# Patient Record
Sex: Male | Born: 1950 | Race: White | Hispanic: No | Marital: Married | Smoking: Never smoker
Health system: Southern US, Community
[De-identification: ages and names within clinical notes are randomized; demographics above are authoritative.]

## PROBLEM LIST (undated history)

## (undated) DIAGNOSIS — N200 Calculus of kidney: Secondary | ICD-10-CM

## (undated) DIAGNOSIS — I4510 Unspecified right bundle-branch block: Secondary | ICD-10-CM

## (undated) DIAGNOSIS — M1990 Unspecified osteoarthritis, unspecified site: Secondary | ICD-10-CM

## (undated) DIAGNOSIS — K8020 Calculus of gallbladder without cholecystitis without obstruction: Secondary | ICD-10-CM

## (undated) DIAGNOSIS — K2980 Duodenitis without bleeding: Secondary | ICD-10-CM

## (undated) DIAGNOSIS — K648 Other hemorrhoids: Secondary | ICD-10-CM

## (undated) DIAGNOSIS — H269 Unspecified cataract: Secondary | ICD-10-CM

## (undated) DIAGNOSIS — K5730 Diverticulosis of large intestine without perforation or abscess without bleeding: Secondary | ICD-10-CM

## (undated) DIAGNOSIS — K219 Gastro-esophageal reflux disease without esophagitis: Secondary | ICD-10-CM

## (undated) DIAGNOSIS — U071 COVID-19: Secondary | ICD-10-CM

## (undated) DIAGNOSIS — I341 Nonrheumatic mitral (valve) prolapse: Secondary | ICD-10-CM

## (undated) DIAGNOSIS — J302 Other seasonal allergic rhinitis: Secondary | ICD-10-CM

## (undated) DIAGNOSIS — Z8719 Personal history of other diseases of the digestive system: Secondary | ICD-10-CM

## (undated) HISTORY — DX: Calculus of gallbladder without cholecystitis without obstruction: K80.20

## (undated) HISTORY — DX: Unspecified cataract: H26.9

## (undated) HISTORY — DX: Diverticulosis of large intestine without perforation or abscess without bleeding: K57.30

## (undated) HISTORY — PX: EYE SURGERY: SHX253

## (undated) HISTORY — DX: Other hemorrhoids: K64.8

## (undated) HISTORY — DX: Unspecified right bundle-branch block: I45.10

## (undated) HISTORY — DX: Gastro-esophageal reflux disease without esophagitis: K21.9

## (undated) HISTORY — DX: Calculus of kidney: N20.0

## (undated) HISTORY — DX: COVID-19: U07.1

## (undated) HISTORY — DX: Duodenitis without bleeding: K29.80

## (undated) HISTORY — PX: DENTAL SURGERY: SHX609

---

## 1976-12-13 HISTORY — PX: WISDOM TOOTH EXTRACTION: SHX21

## 2000-09-16 ENCOUNTER — Encounter (INDEPENDENT_AMBULATORY_CARE_PROVIDER_SITE_OTHER): Payer: Self-pay | Admitting: Specialist

## 2000-09-16 ENCOUNTER — Other Ambulatory Visit: Admission: RE | Admit: 2000-09-16 | Discharge: 2000-09-16 | Payer: Self-pay | Admitting: Internal Medicine

## 2003-05-21 ENCOUNTER — Encounter: Payer: Self-pay | Admitting: Internal Medicine

## 2005-10-04 ENCOUNTER — Ambulatory Visit: Payer: Self-pay | Admitting: Internal Medicine

## 2005-11-02 ENCOUNTER — Ambulatory Visit: Payer: Self-pay | Admitting: Internal Medicine

## 2008-12-02 ENCOUNTER — Telehealth: Payer: Self-pay | Admitting: Internal Medicine

## 2008-12-17 DIAGNOSIS — K219 Gastro-esophageal reflux disease without esophagitis: Secondary | ICD-10-CM

## 2008-12-17 DIAGNOSIS — K2980 Duodenitis without bleeding: Secondary | ICD-10-CM | POA: Insufficient documentation

## 2008-12-17 DIAGNOSIS — K922 Gastrointestinal hemorrhage, unspecified: Secondary | ICD-10-CM | POA: Insufficient documentation

## 2008-12-20 ENCOUNTER — Ambulatory Visit: Payer: Self-pay | Admitting: Internal Medicine

## 2008-12-20 DIAGNOSIS — K648 Other hemorrhoids: Secondary | ICD-10-CM | POA: Insufficient documentation

## 2010-10-19 ENCOUNTER — Encounter: Payer: Self-pay | Admitting: Internal Medicine

## 2010-10-20 ENCOUNTER — Telehealth: Payer: Self-pay | Admitting: Internal Medicine

## 2010-11-18 ENCOUNTER — Ambulatory Visit: Payer: Self-pay | Admitting: Internal Medicine

## 2011-01-12 NOTE — Progress Notes (Signed)
Summary: Medication  Phone Note Call from Patient Call back at Home Phone [phone]   Caller: Patient Call For: Dr. Juanda Chance Reason for Call: Talk to Nurse Summary of Call: Needs a refill on his ANUSOL.Marland Kitchensch'd appt. on 11-18-10 Initial call taken by: Karna Christmas,  October 20, 2010 10:53 AM  Follow-up for Phone Call        Patient states that he just needs refills on anusol suppositories to keep onhand for hemorrhoidal flare ups. He denies any associated abdominal pain, change in bowels ect with his rectal bleeding. I will send him a 1 month supply. Patient is scheduled for a followup on 11/18/10 (his last visit with [place] was in January 2010, almost 2 years ago) and he would like to confirm with the physician that she does need to see him for routine follow up... Follow-up by: Lamona Curl CMA Duncan Dull),  October 20, 2010 11:20 AM  Additional Follow-up for Phone Call Additional follow up Details #1::        I agree. Additional Follow-up by: Hart Carwin MD,  October 20, 2010 1:36 PM    New/Updated Medications: ANUSOL-HC 25 MG SUPP (HYDROCORTISONE ACETATE) Insert suppository into rectum once at bedtime. Prescriptions: ANUSOL-HC 25 MG SUPP (HYDROCORTISONE ACETATE) Insert suppository into rectum once at bedtime.  #12 x 0   Entered by:   Lamona Curl CMA (AAMA)   Authorized by:   Hart Carwin MD   Signed by:   Lamona Curl CMA (AAMA) on 10/20/2010   Method used:   Electronically to        [facility] (retail)       [address] [place], [place]  [address]       Ph: [phone]       Fax: [phone]   RxID:   [id number]

## 2011-01-12 NOTE — Assessment & Plan Note (Signed)
Summary: med refill--ch.   History of Present Illness Visit Type: Follow-up Visit Primary GI MD: Lina Sar MD Primary Provider: Fredia Beets, MD  Requesting Provider: na Chief Complaint: Pt needs refill on Anucort and has questions about Nexium. Pt denies any GI complaints  History of Present Illness:   This is a 60 year old white male with symptomatic internal hemorrhoids. He needs a refill on his Anusol-HC suppositories. His last appointment was in January 2010. He has used up only one prescription since his last appointment. His hemorrhoids do not bother him often but when they do the suppositories usually relieve the symptoms within 2 or 3 days. Patient's last colonoscopy in 2006 confirmed the presence of hemorrhoids. A prior colonoscopy in November 2001 showed a hyperplastic polyp. There is a family history of colon cancer in patient's mother's sister. He is treated for gastroesophageal reflux with Nexium 40 mg daily. His last upper endoscopy in 2001 showed a hiatal hernia and duodenitis. His H. pylori was negative.   GI Review of Systems      Denies abdominal pain, acid reflux, belching, bloating, chest pain, dysphagia with liquids, dysphagia with solids, heartburn, loss of appetite, nausea, vomiting, vomiting blood, weight loss, and  weight gain.      Reports hemorrhoids.     Denies anal fissure, black tarry stools, change in bowel habit, constipation, diarrhea, diverticulosis, fecal incontinence, heme positive stool, irritable bowel syndrome, jaundice, light color stool, liver problems, rectal bleeding, and  rectal pain.    Current Medications (verified): 1)  Nexium 40 Mg Cpdr (Esomeprazole Magnesium) .Marland Kitchen.. 1 By Mouth Once Daily 2)  Zantac 150 Mg Caps (Ranitidine Hcl) .Marland Kitchen.. 1 By Mouth Once Daily 3)  Hyoscyamine Sulfate 0.125 Mg Tbdp (Hyoscyamine Sulfate) .... As Needed 4)  Optivar 0.05 % Soln (Azelastine Hcl) .... 2-3 Per Day 5)  Luxiq 0.12 % Foam (Betamethasone Valerate) .... As  Needed 6)  Metrolotion 0.75 % Lotn (Metronidazole) .Marland Kitchen.. 1-2 Per Day 7)  Zolpidem Tartrate 5 Mg Tabs (Zolpidem Tartrate) .Marland Kitchen.. 1 By Mouth Once Daily 8)  Skelaxin 800 Mg Tabs (Metaxalone) .... As Needed 9)  Beano  Tabs (Alpha-D-Galactosidase) .... As Needed 10)  Anti-Gas Ultra Strength 180 Mg Caps (Simethicone) .... As Needed 11)  Anusol-Hc 25 Mg Supp (Hydrocortisone Acetate) .... Insert Suppository Into Rectum Once At Bedtime. 12)  Allegra Allergy 60 Mg Tabs (Fexofenadine Hcl) .... By Mouth Once Daily  Allergies (verified): 1)  ! Pcn 2)  ! Sulfa  Past History:  Past Medical History: HEMORRHOIDS, INTERNAL (ICD-455.0) Hx of GI BLEEDING (ICD-578.9) Family Hx of COLON CANCER (ICD-153.9) Hx of DUODENITIS (ICD-535.60) GERD (ICD-530.81)  Past Surgical History: Reviewed history from 12/17/2008 and no changes required. dental surgery  Family History: Reviewed history from 12/17/2008 and no changes required. Family History of Colon Cancer: Maternal Aunt Family History of Prostate Cancer: Paternal Uncle Family History of Heart Disease: Mother, Father  Social History: Reviewed history from 12/17/2008 and no changes required. Occupation: Education officer, environmental Alcohol Use - no Illicit Drug Use - no  Review of Systems       The patient complains of allergy/sinus.  The patient denies anemia, anxiety-new, arthritis/joint pain, back pain, blood in urine, breast changes/lumps, change in vision, confusion, cough, coughing up blood, depression-new, fainting, fatigue, fever, headaches-new, hearing problems, heart murmur, heart rhythm changes, itching, menstrual pain, muscle pains/cramps, night sweats, nosebleeds, pregnancy symptoms, shortness of breath, skin rash, sleeping problems, sore throat, swelling of feet/legs, swollen lymph glands, thirst - excessive , urination - excessive ,  urination changes/pain, urine leakage, vision changes, and voice change.         Pertinent positive and negative review of systems  were noted in the above HPI. All other ROS was otherwise negative.   Vital Signs:  Patient profile:   60 year old male Height:      70 inches Weight:      173 pounds BMI:     24.91 BSA:     1.96 Pulse rate:   80 / minute Pulse rhythm:   regular BP sitting:   128 / 64  (left arm) Cuff size:   regular  Vitals Entered By: Ok Anis CMA (November 18, 2010 8:18 AM)   Impression & Recommendations:  Problem # 1:  HEMORRHOIDS, INTERNAL (ICD-455.0) Patient has symptomatic internal hemorrhoids responsive to [facility] suppositories. Patient is satisfied with using suppositories. We will refill this. He is due for a repeat colonoscopy in November 2013.  Problem # 2:  Family Hx of COLON CANCER (ICD-153.9) There is a family history of colon cancer in a maternal aunt. A recall colonoscopy will be due in November 2013.  Problem # 3:  GERD (ICD-530.81) continue Nexium 40 mg daily  Patient Instructions: 1)  Refill Anusol-HC suppositories one q.h.s. p.r.n. 2)  High-fiber diet. 3)  Continue Nexium 40 mg daily. 4)  A recall colonoscopy will be due in November 2013. 5)  Copy sent to : Dr Kathyrn Lass 6)  The medication list was reviewed and reconciled.  All changed / newly prescribed medications were explained.  A complete medication list was provided to the patient / caregiver. Prescriptions: ANUSOL-HC 25 MG SUPP (HYDROCORTISONE ACETATE) Insert suppository into rectum once at bedtime.  #12 x 2   Entered by:   Lamona Curl CMA (AAMA)   Authorized by:   Hart Carwin MD   Signed by:   Lamona Curl CMA (AAMA) on 11/18/2010   Method used:   Electronically to        [facility] (retail)       [address] [place], [place]  [address]       Ph: [phone]       Fax: [phone]   RxID:   [id number]

## 2011-01-12 NOTE — Miscellaneous (Signed)
Summary: Anusol Suppositories  Clinical Lists Changes  Medications: Changed medication from ANUSOL-HC 25 MG SUPP (HYDROCORTISONE ACETATE) Insert suppository into rectum once at bedtime to ANUSOL-HC 25 MG SUPP (HYDROCORTISONE ACETATE) Insert suppository into rectum once at bedtime. MUST HAVE OFFICE VISITS FOR FURTHER REFILLS! - Signed Rx of ANUSOL-HC 25 MG SUPP (HYDROCORTISONE ACETATE) Insert suppository into rectum once at bedtime. MUST HAVE OFFICE VISITS FOR FURTHER REFILLS!;  #12 x 0;  Signed;  Entered by: Lamona Curl CMA (AAMA);  Authorized by: Hart Carwin MD;  Method used: Electronically to [facility] [address], [place], [place], [place]  [address], Ph: [phone], Fax: [phone]    Prescriptions: ANUSOL-HC 25 MG SUPP (HYDROCORTISONE ACETATE) Insert suppository into rectum once at bedtime. MUST HAVE OFFICE VISITS FOR FURTHER REFILLS!  #12 x 0   Entered by:   Lamona Curl CMA (AAMA)   Authorized by:   Hart Carwin MD   Signed by:   Lamona Curl CMA (AAMA) on 10/19/2010   Method used:   Electronically to        [facility] (retail)       [address] [place], [place]  [address]       Ph: [phone]       Fax: [phone]   RxID:   [id number]   Appended Document: Anusol Suppositories prescription failed to go through....patient actually needs an office visit anyways. I have called an left him a message to schedule appointment.

## 2011-12-14 HISTORY — PX: COLONOSCOPY: SHX174

## 2012-09-14 ENCOUNTER — Encounter: Payer: Self-pay | Admitting: Internal Medicine

## 2012-09-25 ENCOUNTER — Encounter: Payer: Self-pay | Admitting: Internal Medicine

## 2012-11-15 ENCOUNTER — Ambulatory Visit (AMBULATORY_SURGERY_CENTER): Payer: 59

## 2012-11-15 VITALS — Ht 71.25 in | Wt 175.0 lb

## 2012-11-15 DIAGNOSIS — Z800 Family history of malignant neoplasm of digestive organs: Secondary | ICD-10-CM

## 2012-11-15 DIAGNOSIS — Z860100 Personal history of colon polyps, unspecified: Secondary | ICD-10-CM

## 2012-11-15 DIAGNOSIS — Z1211 Encounter for screening for malignant neoplasm of colon: Secondary | ICD-10-CM

## 2012-11-15 MED ORDER — MOVIPREP 100 G PO SOLR: 1.0000 | Freq: Once | ORAL | Status: DC

## 2012-11-29 ENCOUNTER — Ambulatory Visit (AMBULATORY_SURGERY_CENTER): Payer: 59 | Admitting: Internal Medicine

## 2012-11-29 ENCOUNTER — Encounter: Payer: Self-pay | Admitting: Internal Medicine

## 2012-11-29 VITALS — BP 122/81 | HR 70 | Temp 97.5°F | Resp 17 | Ht 71.25 in | Wt 175.0 lb

## 2012-11-29 DIAGNOSIS — K5730 Diverticulosis of large intestine without perforation or abscess without bleeding: Secondary | ICD-10-CM

## 2012-11-29 DIAGNOSIS — Z800 Family history of malignant neoplasm of digestive organs: Secondary | ICD-10-CM

## 2012-11-29 DIAGNOSIS — Z860100 Personal history of colon polyps, unspecified: Secondary | ICD-10-CM

## 2012-11-29 DIAGNOSIS — Z1211 Encounter for screening for malignant neoplasm of colon: Secondary | ICD-10-CM

## 2012-11-29 HISTORY — DX: Diverticulosis of large intestine without perforation or abscess without bleeding: K57.30

## 2012-11-29 MED ORDER — SODIUM CHLORIDE 0.9 % IV SOLN: 500.0000 mL | INTRAVENOUS | Status: DC

## 2012-11-29 NOTE — Patient Instructions (Addendum)
Handouts were given to your care partner on diverticulosis and high fiber diet.  You may resume your current medications today.  Pleae call if any questions or concerns.    YOU HAD AN ENDOSCOPIC PROCEDURE TODAY AT THE [facility]: Refer to the procedure report that was given to you for any specific questions about what was found during the examination.  If the procedure report does not answer your questions, please call your gastroenterologist to clarify.  If you requested that your care partner not be given the details of your procedure findings, then the procedure report has been included in a sealed envelope for you to review at your convenience later.  YOU SHOULD EXPECT: Some feelings of bloating in the abdomen. Passage of more gas than usual.  Walking can help get rid of the air that was put into your GI tract during the procedure and reduce the bloating. If you had a lower endoscopy (such as a colonoscopy or flexible sigmoidoscopy) you may notice spotting of blood in your stool or on the toilet paper. If you underwent a bowel prep for your procedure, then you may not have a normal bowel movement for a few days.  DIET: Your first meal following the procedure should be a light meal and then it is ok to progress to your normal diet.  A half-sandwich or bowl of soup is an example of a good first meal.  Heavy or fried foods are harder to digest and may make you feel nauseous or bloated.  Likewise meals heavy in dairy and vegetables can cause extra gas to form and this can also increase the bloating.  Drink plenty of fluids but you should avoid alcoholic beverages for 24 hours.  ACTIVITY: Your care partner should take you home directly after the procedure.  You should plan to take it easy, moving slowly for the rest of the day.  You can resume normal activity the day after the procedure however you should NOT DRIVE or use heavy machinery for 24 hours (because of the sedation medicines used  during the test).    SYMPTOMS TO REPORT IMMEDIATELY: A gastroenterologist can be reached at any hour.  During normal business hours, 8:30 AM to 5:00 PM Monday through Friday, call [phone].  After hours and on weekends, please call the GI answering service at [phone] who will take a message and have the physician on call contact you.   Following lower endoscopy (colonoscopy or flexible sigmoidoscopy):  Excessive amounts of blood in the stool  Significant tenderness or worsening of abdominal pains  Swelling of the abdomen that is new, acute  Fever of 100F or higher   FOLLOW UP: If any biopsies were taken you will be contacted by phone or by letter within the next 1-3 weeks.  Call your gastroenterologist if you have not heard about the biopsies in 3 weeks.  Our staff will call the home number listed on your records the next business day following your procedure to check on you and address any questions or concerns that you may have at that time regarding the information given to you following your procedure. This is a courtesy call and so if there is no answer at the home number and we have not heard from you through the emergency physician on call, we will assume that you have returned to your regular daily activities without incident.  SIGNATURES/CONFIDENTIALITY: You and/or your care partner have signed paperwork which will be entered into your electronic  medical record.  These signatures attest to the fact that that the information above on your After Visit Summary has been reviewed and is understood.  Full responsibility of the confidentiality of this discharge information lies with you and/or your care-partner.  

## 2012-11-29 NOTE — Progress Notes (Signed)
No complaints noted in the recovery room. Maw  Patient did not experience any of the following events: a burn prior to discharge; a fall within the facility; wrong site/side/patient/procedure/implant event; or a hospital transfer or hospital admission upon discharge from the facility. (G8907) Patient did not have preoperative order for IV antibiotic SSI prophylaxis. (G8918)  

## 2012-11-29 NOTE — Op Note (Signed)
[facility] [address] [place], [address]   COLONOSCOPY PROCEDURE REPORT  PATIENT: Raymond, Wood  MR#: [id number] BIRTHDATE: 1951/11/17 , 61  yrs. old GENDER: Male ENDOSCOPIST: Hart Carwin, MD REFERRED BY:  Lindaann Slough, M.D. PROCEDURE DATE:  11/29/2012 PROCEDURE:   Colonoscopy, surveillance ASA CLASS:   Class II INDICATIONS:Patient's personal history of colon polyps and maternal aunt with colon cancer, hyperplastic polyp in 2001, last colon 2006. MEDICATIONS: MAC sedation, administered by CRNA and propofol (Diprivan) 300mg  IV  DESCRIPTION OF PROCEDURE:   After the risks and benefits and of the procedure were explained, informed consent was obtained.  A digital rectal exam revealed no abnormalities of the rectum.    The LB PCF-H180AL [id number] and LB PCF-H180AL [id number]  endoscope was introduced through the anus and advanced to the cecum, which was identified by both the appendix and ileocecal valve .  The quality of the prep was good, using MoviPrep .  The instrument was then slowly withdrawn as the colon was fully examined.     COLON FINDINGS: Mild diverticulosis was noted in the sigmoid colon. Retroflexed views revealed no abnormalities.     The scope was then withdrawn from the patient and the procedure completed.  COMPLICATIONS: There were no complications. ENDOSCOPIC IMPRESSION: Mild diverticulosis was noted in the sigmoid colon  RECOMMENDATIONS: High fiber diet   REPEAT EXAM: for Colonoscopy. 10 years  cc:  _______________________________ eSignedHart Carwin, MD 11/29/2012 9:49 AM

## 2012-11-30 ENCOUNTER — Telehealth: Payer: Self-pay | Admitting: *Deleted

## 2012-11-30 NOTE — Telephone Encounter (Signed)
  Follow up Call-  Call back number 11/29/2012  Post procedure Call Back phone  # [phone]  Permission to leave phone message Yes     Patient questions:  Do you have a fever, pain , or abdominal swelling? no Pain Score  0 *  Have you tolerated food without any problems? yes  Have you been able to return to your normal activities? yes  Do you have any questions about your discharge instructions: Diet   no Medications  no Follow up visit  no  Do you have questions or concerns about your Care? no  Actions: * If pain score is 4 or above:0 No action needed, pain <4.

## 2013-02-26 ENCOUNTER — Telehealth: Payer: Self-pay | Admitting: Internal Medicine

## 2013-02-26 MED ORDER — HYDROCORTISONE ACETATE 25 MG RE SUPP: 25.0000 mg | RECTAL | Status: DC | PRN

## 2013-02-26 NOTE — Telephone Encounter (Signed)
Limited supply of Anusol HC sent to patient's pharmacy.

## 2013-02-27 ENCOUNTER — Telehealth: Payer: Self-pay | Admitting: Internal Medicine

## 2013-02-27 ENCOUNTER — Ambulatory Visit (INDEPENDENT_AMBULATORY_CARE_PROVIDER_SITE_OTHER): Payer: Managed Care, Other (non HMO) | Admitting: Physician Assistant

## 2013-02-27 ENCOUNTER — Encounter: Payer: Self-pay | Admitting: *Deleted

## 2013-02-27 VITALS — BP 112/78 | HR 73 | Ht 71.0 in | Wt 174.0 lb

## 2013-02-27 DIAGNOSIS — K5730 Diverticulosis of large intestine without perforation or abscess without bleeding: Secondary | ICD-10-CM | POA: Insufficient documentation

## 2013-02-27 DIAGNOSIS — K589 Irritable bowel syndrome without diarrhea: Secondary | ICD-10-CM

## 2013-02-27 DIAGNOSIS — R110 Nausea: Secondary | ICD-10-CM

## 2013-02-27 NOTE — Progress Notes (Signed)
Subjective:    Patient ID: Raymond Wood, male    DOB: 07-06-1951, 62 y.o.   MRN: [id number]  HPI Raymond Wood is a pleasant 62 year old white male known to Dr. Vincent Gros who has history of IBS, GERD, colon polyps and hemorrhoids. He  does have positive family history of colon cancer in a maternal aunt. He just had repeat colonoscopy in December of 2013 it did not have any recurrent polyps, he does have mild diverticular disease. He states that he had an episode of prostatitis about a month ago and initially was started on a course of Cipro but after a few doses had some mild nausea and was switched to doxycycline which he took for 3 weeks. He says generally his bowel habits are very regular but since being on antibiotics and after finishing antibiotics he is still having 2 bowel movements daily though no diarrhea. He is also developed some nausea which is most notable early in the mornings over the past few days. He has not had any vomiting. Also had an increase in gas. His wife encouraged him to come in because of the nausea. Again has been eating pretty well, he has taken some Levsin which seems to help the nausea. He has been on Nexium 40 mg by mouth daily for a long time and takes Zantac at bedtime intermittently. He denies any heartburn indigestion dysphagia or odynophagia. He does mention that he always gets GI upset whenever  he takes antibiotics.    Review of Systems  Constitutional: Negative.   Eyes: Negative.   Respiratory: Negative.   Cardiovascular: Negative.   Gastrointestinal: Positive for nausea.  Endocrine: Negative.   Genitourinary: Negative.   Allergic/Immunologic: Negative.   Neurological: Negative.   Psychiatric/Behavioral: Negative.    Outpatient Prescriptions Prior to Visit  Medication Sig Dispense Refill  . Azelaic Acid (FINACEA) 15 % cream Apply topically daily. After skin is thoroughly washed and patted dry, gently but thoroughly massage a thin film of azelaic acid cream  into the affected area twice daily, in the morning and evening.      Marland Kitchen azelastine (OPTIVAR) 0.05 % ophthalmic solution Place 1 drop into both eyes 2 (two) times daily.      . Carboxymethylcellul-Glycerin 0.5-0.9 % SOLN Apply to eye daily.      Marland Kitchen esomeprazole (NEXIUM) 40 MG capsule Take 40 mg by mouth daily before breakfast.      . fexofenadine (ALLEGRA) 180 MG tablet Take 180 mg by mouth daily.      . hydrocortisone (ANUSOL-HC) 25 MG suppository Place 1 suppository (25 mg total) rectally as needed.  12 suppository  0  . hyoscyamine (ANASPAZ) 0.125 MG TBDP Place 0.125 mg under the tongue.      . Magnesium 250 MG TABS Take by mouth as needed.      . metaxalone (SKELAXIN) 800 MG tablet Take 800 mg by mouth daily.      . ranitidine (ZANTAC) 150 MG tablet Take 150 mg by mouth daily.      . Simethicone (PHAZYME) 180 MG CAPS Take by mouth as needed.      . psyllium (REGULOID) 0.52 G capsule Take 0.52 g by mouth daily.      Marland Kitchen zolpidem (AMBIEN) 5 MG tablet Take 2.5 mg by mouth at bedtime as needed.       No facility-administered medications prior to visit.   Allergies  Allergen Reactions  . Penicillins   . Rocephin (Ceftriaxone Sodium In Dextrose)     Blood  in stool  . Sulfonamide Derivatives    Patient Active Problem List  Diagnosis  . HEMORRHOIDS, INTERNAL  . GERD  . DUODENITIS  . GI BLEEDING  . Irritable bowel syndrome (IBS)  . Diverticulosis of colon without hemorrhage   History  Substance Use Topics  . Smoking status: Never Smoker   . Smokeless tobacco: Never Used  . Alcohol Use: Not on file   family history includes Colon cancer in his maternal aunt; Heart disease in his father and mother; and Prostate cancer in his paternal aunt.     Objective:   Physical Exam  white male in no acute distress, quite pleasant blood pressure 112/78 pulse 73 height 5 foot 11 weight 174. HEENT; nontraumatic normocephalic EOMI PERRLA sclera anicteric,Neck; Supple no JVD, Cardiovascular; regular  rate and rhythm with S1-S2 no murmur or gallop, capillary clear bilaterally, Abdomen; soft nontender nondistended bowel sounds are active no palpable mass or hepatosplenomegaly, Rectal ;exam not done, Extremities; no clubbing cyanosis or edema skin warm and dry, Psych; mood and affect normal and appropriate        Assessment & Plan:  #42 62 year old white male with history of IBS now presenting with alteration of bowel habits gas and vague early morning nausea since completing a long course of doxycycline I suspect his symptoms are secondary to alteration of his gut flora by antibiotics, he also may have an antibiotic-induced gastropathy. #2 history of colon polyps-oh recurrent polyps on colonoscopy December 2013 #3 family history of colon cancer-up-to-date with colon  Screening as above  Plan; we'll increase Nexium to 40 mg by mouth twice daily over the next 2 weeks if improved back to once daily Start a probiotic once daily over the next month, he may try Training and development officer. Patient is asked to call back in 2 weeks with progress report if he has not had significant improvement he will need further diagnostic evaluation

## 2013-02-27 NOTE — Progress Notes (Signed)
Reviewed, antibiotic?? Induced gastropathy, if no improvement with current regimen, consider abd .sono, etc

## 2013-02-27 NOTE — Telephone Encounter (Signed)
Patient states he finished antibiotics from PCP about 10 days ago for prostatitis. For the last 3-4 days, he is having abdominal pain, gas and nausea. Scheduled with [name], PA today at 2:00 PM.

## 2013-02-27 NOTE — Patient Instructions (Addendum)
Take Nexium 40 mg twice daily for 14 days. We have given you samples . Then go to once daily. We have given you coupons for Align, a probiotil. Take one capsule daily.  You can get this at your pharmacy or [employer], [employer], [employer].    You may also take Levsin or Hyoscyamine for nausea and spasms. Call us back in 2 weeks if you are not significantly better.

## 2013-12-13 HISTORY — PX: BLEPHAROPLASTY: SUR158

## 2014-12-20 ENCOUNTER — Other Ambulatory Visit (INDEPENDENT_AMBULATORY_CARE_PROVIDER_SITE_OTHER): Payer: Managed Care, Other (non HMO)

## 2014-12-20 ENCOUNTER — Ambulatory Visit (INDEPENDENT_AMBULATORY_CARE_PROVIDER_SITE_OTHER): Payer: Managed Care, Other (non HMO) | Admitting: Physician Assistant

## 2014-12-20 ENCOUNTER — Encounter: Payer: Self-pay | Admitting: Physician Assistant

## 2014-12-20 ENCOUNTER — Telehealth: Payer: Self-pay | Admitting: Internal Medicine

## 2014-12-20 VITALS — BP 122/80 | HR 76 | Ht 72.0 in | Wt 176.0 lb

## 2014-12-20 DIAGNOSIS — K5730 Diverticulosis of large intestine without perforation or abscess without bleeding: Secondary | ICD-10-CM

## 2014-12-20 DIAGNOSIS — K589 Irritable bowel syndrome without diarrhea: Secondary | ICD-10-CM

## 2014-12-20 LAB — CBC WITH DIFFERENTIAL/PLATELET
BASOS ABS: 0 10*3/uL (ref 0.0–0.1)
Basophils Relative: 0.5 % (ref 0.0–3.0)
Eosinophils Absolute: 0 10*3/uL (ref 0.0–0.7)
Eosinophils Relative: 0.9 % (ref 0.0–5.0)
HEMATOCRIT: 42 % (ref 39.0–52.0)
Hemoglobin: 14.1 g/dL (ref 13.0–17.0)
Lymphocytes Relative: 24.3 % (ref 12.0–46.0)
Lymphs Abs: 1.2 10*3/uL (ref 0.7–4.0)
MCHC: 33.5 g/dL (ref 30.0–36.0)
MCV: 97.6 fl (ref 78.0–100.0)
Monocytes Absolute: 0.4 10*3/uL (ref 0.1–1.0)
Monocytes Relative: 8.7 % (ref 3.0–12.0)
NEUTROS ABS: 3.2 10*3/uL (ref 1.4–7.7)
Neutrophils Relative %: 65.6 % (ref 43.0–77.0)
Platelets: 171 10*3/uL (ref 150.0–400.0)
RBC: 4.3 Mil/uL (ref 4.22–5.81)
RDW: 13.1 % (ref 11.5–15.5)
WBC: 4.8 10*3/uL (ref 4.0–10.5)

## 2014-12-20 MED ORDER — HYOSCYAMINE SULFATE 0.125 MG SL SUBL: SUBLINGUAL_TABLET | SUBLINGUAL | Status: DC

## 2014-12-20 NOTE — Patient Instructions (Signed)
We have given you  Literature on a Low fat diet and High Fiber Diet. We [facility] [address].   Take Benefiber, 1 heaping tablespoon in a glass of water or juice daily.  Call us in 1 week and let us know how you are feeling.  You may ask for the nurse, Rene Kocheregina.

## 2014-12-20 NOTE — Telephone Encounter (Signed)
Spoke with patient and he reports bloating, gas and abdominal discomfort for several weeks. States the symptoms are increasing. Denies constipation or diarrhea. Scheduled with Lawson FiscalLori Hvozdovic, PA-C today at 2:15 PM.

## 2014-12-20 NOTE — Progress Notes (Signed)
Patient ID: Raymond Wood, male   DOB: 05/13/1951, 64 y.o.   MRN: [id number]     History of Present Illness:   Raymond Wood is a 64 year old male who presents today for evaluation of lower abdominal discomfort of one month's duration.  The patient is known to Dr. Juanda ChanceBrodie with a history of GERD, IBS, diverticulosis, colon polyps, and hemorrhoids. He last had a colonoscopy in December 2013 with no polyps but he was noted to have mild diverticular disease. He states that 5 years ago he had an episode of diverticulitis and was treated with a course of Cipro and Flagyl. Since that time he states once or twice a year, he will developed lower abdominal crampy pain with gas and bloating and erratic bowel movements. Each time he contacts a friend who is a physician and has been treated with Cipro alone because he states the metronidazole was difficult for him to tolerate. In the first week of December 2015 he started with mild symptoms of diverticulitis which he describes has mild left lower quadrant and suprapubic discomfort, with gas, bloating, gurgling. He does not have any associated fever, chills, night sweats, nausea or vomiting. He spoke to his friend and was prescribed 10 days of Cipro. While on the Cipro he had total resolution of his symptoms but soon thereafter they reoccurred and he feels he never quite cleared up. He describes mild left lower quadrant and suprapubic discomfort but not pain. His discomfort is more pronounced postprandially and is described has some cramping relieved with passage of flatulence or defecation. He has been avoiding fiber in his stools have been soft formed. He has had no bright red blood per rectum or melena.   Past Medical History  Diagnosis Date  . Right bundle branch block   . Diverticulosis of sigmoid colon 11/29/2012  . Internal hemorrhoids without mention of complication   . Duodenitis without mention of hemorrhage   . GERD (gastroesophageal reflux disease)     Past  Surgical History  Procedure Laterality Date  . No surgeries other than dental     Family History  Problem Relation Age of Onset  . Colon cancer Maternal Aunt   . Prostate cancer Paternal Aunt   . Heart disease Mother   . Heart disease Father    History  Substance Use Topics  . Smoking status: Never Smoker   . Smokeless tobacco: Never Used  . Alcohol Use: Not on file   Current Outpatient Prescriptions  Medication Sig Dispense Refill  . Azelaic Acid (FINACEA) 15 % cream Apply topically daily. After skin is thoroughly washed and patted dry, gently but thoroughly massage a thin film of azelaic acid cream into the affected area twice daily, in the morning and evening.    Marland Kitchen. azelastine (OPTIVAR) 0.05 % ophthalmic solution Place 1 drop into both eyes 2 (two) times daily.    . cetirizine (ZYRTEC) 10 MG tablet Take 10 mg by mouth daily.    Marland Kitchen. esomeprazole (NEXIUM) 40 MG capsule Take 40 mg by mouth daily before breakfast.    . hydrocortisone (ANUSOL-HC) 25 MG suppository Place 1 suppository (25 mg total) rectally as needed. 12 suppository 0  . hyoscyamine (ANASPAZ) 0.125 MG TBDP Place 0.125 mg under the tongue.    . Magnesium 250 MG TABS Take by mouth as needed.    . metaxalone (SKELAXIN) 800 MG tablet Take 800 mg by mouth daily.    . Probiotic Product (ALIGN) 4 MG CAPS Take by mouth.    .Marland Kitchen  ranitidine (ZANTAC) 150 MG tablet Take 150 mg by mouth daily.    . Simethicone (PHAZYME) 180 MG CAPS Take by mouth as needed.    . tazarotene (TAZORAC) 0.05 % cream Apply 0.05 % topically at bedtime.    . hyoscyamine (LEVSIN/SL) 0.125 MG SL tablet Place 1 tablet under the tongue 3 times a day as needed. 90 tablet 1   No current facility-administered medications for this visit.   Allergies  Allergen Reactions  . Penicillins   . Rocephin [Ceftriaxone Sodium In Dextrose]     Blood in stool  . Sulfonamide Derivatives       Review of Systems: Gen: Denies any fever, chills, sweats, anorexia, fatigue,  weakness, malaise, weight loss, and sleep disorder CV: Denies chest pain, angina, palpitations, syncope, orthopnea, PND, peripheral edema, and claudication. Resp: Denies dyspnea at rest, dyspnea with exercise, cough, sputum, wheezing, coughing up blood, and pleurisy. GI: Denies vomiting blood, jaundice, and fecal incontinence.   Denies dysphagia or odynophagia. GU : Denies urinary burning, blood in urine, urinary frequency, urinary hesitancy, nocturnal urination, and urinary incontinence. MS: Denies joint pain, limitation of movement, and swelling, stiffness, low back pain, extremity pain. Denies muscle weakness, cramps, atrophy.  Derm: Denies rash, itching, dry skin, hives, moles, warts, or unhealing ulcers.  Psych: Denies depression, anxiety, memory loss, suicidal ideation, hallucinations, paranoia, and confusion. Heme: Denies bruising, bleeding, and enlarged lymph nodes. Neuro:  Denies any headaches, dizziness, paresthesia Endo:  Denies any problems with DM, thyroid, adrenal  LAB RESULTS:  Recent Labs  12/20/14 1502  WBC 4.8  HGB 14.1  HCT 42.0  PLT 171.0     Physical Exam: General: Pleasant, well developed male in no acute distress Head: Normocephalic and atraumatic Eyes:  sclerae anicteric, conjunctiva pink  Ears: Normal auditory acuity Lungs: Clear throughout to auscultation Heart: Regular rate and rhythm Abdomen: Soft, non distended, non-tender. No masses, no hepatomegaly. Normal bowel sounds. No CVAT. Musculoskeletal: Symmetrical with no gross deformities  Extremities: No edema  Neurological: Alert oriented x 4, grossly nonfocal Psychological:  Alert and cooperative. Normal mood and affect  Assessment and Recommendations: Study 48-year-old male with a history of IBS and diverticular disease presenting now with a several week history of vague, mild, crampy lower abdominal discomfort associated with gas and bloating. He readily admits to multiple dietary indiscretions since  Thanksgiving with a diet of high fat-containing foods and minimal fiber. His symptoms are likely due to IBS or a segment of spastic diverticular disease. He's been instructed to reintroduce fiber to his diet and decrease fat content in his diet. A CBC will be obtained today, and if his white blood cell count is elevated and/or if his pain becomes worse, a CT of the abdomen and pelvis will be obtained however as of today's visit he is nontender with no rebound or guarding. He will use Benefiber heaping tablespoon daily. He will be given a trial of Levsin 0.125 mg 1 by mouth 3 times a day when necessary. He's been instructed to call us in a week and let us know how he is feeling, sooner if needed.   Aundrey Elahi, Moise Boring 12/20/2014,

## 2014-12-21 NOTE — Progress Notes (Signed)
Reviewed and agree with LevsinSL tid, dietary modifications. If no improvement in a certain timde ( few weeks)  Will obtain imaging of the abdomen.

## 2017-08-05 ENCOUNTER — Ambulatory Visit (INDEPENDENT_AMBULATORY_CARE_PROVIDER_SITE_OTHER): Payer: Medicare Other | Admitting: Physician Assistant

## 2017-08-05 ENCOUNTER — Encounter: Payer: Self-pay | Admitting: Physician Assistant

## 2017-08-05 ENCOUNTER — Other Ambulatory Visit (INDEPENDENT_AMBULATORY_CARE_PROVIDER_SITE_OTHER): Payer: Medicare Other

## 2017-08-05 VITALS — BP 100/70 | HR 80 | Ht 71.25 in | Wt 166.2 lb

## 2017-08-05 DIAGNOSIS — K8020 Calculus of gallbladder without cholecystitis without obstruction: Secondary | ICD-10-CM | POA: Diagnosis not present

## 2017-08-05 DIAGNOSIS — R7989 Other specified abnormal findings of blood chemistry: Secondary | ICD-10-CM

## 2017-08-05 DIAGNOSIS — K8050 Calculus of bile duct without cholangitis or cholecystitis without obstruction: Secondary | ICD-10-CM | POA: Diagnosis not present

## 2017-08-05 DIAGNOSIS — R945 Abnormal results of liver function studies: Secondary | ICD-10-CM

## 2017-08-05 LAB — HEPATIC FUNCTION PANEL
ALT: 19 U/L (ref 0–53)
AST: 20 U/L (ref 0–37)
Albumin: 4.2 g/dL (ref 3.5–5.2)
Alkaline Phosphatase: 85 U/L (ref 39–117)
BILIRUBIN TOTAL: 0.9 mg/dL (ref 0.2–1.2)
Bilirubin, Direct: 0.2 mg/dL (ref 0.0–0.3)
Total Protein: 7.1 g/dL (ref 6.0–8.3)

## 2017-08-05 NOTE — Patient Instructions (Addendum)
Your physician has requested that you go to the basement for the following lab work before leaving today: Hepatic function panel  Please follow a low fat diet and eat small meals.  We will send our note from today to Dr Magnus Ivan with [facility].  You will be established with Dr Marina Goodell.   Go to the emergency room in [place] if you have any recurrent severe attacks.  If you are age 66 or older, your body mass index should be between 23-30. Your Body mass index is 23.02 kg/m. If this is out of the aforementioned range listed, please consider follow up with your Primary Care Provider.  If you are age 48 or younger, your body mass index should be between 19-25. Your Body mass index is 23.02 kg/m. If this is out of the aformentioned range listed, please consider follow up with your Primary Care Provider.

## 2017-08-05 NOTE — Progress Notes (Signed)
Subjective:    Patient ID: Raymond Wood, male    DOB: 09/28/1951, 66 y.o.   MRN: [id number]  HPI [name] is a pleasant 66 year old white male, known previously to Dr. Lina Sar last seen in 2016 who comes in today after recent ER visits with finding of elevated LFTs. Patient has history of GERD, IBS, diverticulosis, mitral valve prolapse prior history of a wide complex tachycardia. He had colonoscopy in 2013 per Dr. Juanda Chance for previous history of polyps. He had no polyps noted and mild sigmoid diverticulosis.  Patient had an episode on 07/18/2017 with acute upper abdominal pain and pressure without any radiation that lasted for about 12 hours. This was not associated with fever chills nausea vomiting or diarrhea. He says the pain was not excruciating just uncomfortable and he did not seek ER care at that time. 2 days later he developed acute severe left back and flank pain which did take him to the emergency room. CT of the abdomen and pelvis without contrast did show gallstones with possible gallbladder added no myomatosis there was no gallbladder wall thickening, he does have mild pancreatic fatty changes, normal-appearing liver and had a 5 mm stone in the distal right ureter and a 3 mm stone in the left ureter at the ureterovesical junction with mild left hydronephrosis. He says later that same day he did pass 1 stone, and in his pain subsided. This pain has not recurred. Labs done in the ER with that visit showed a total bili of 1.4 AST 246 ALT of 381 and alkaline phosphatase of 148.  Patient had another episode of very similar upper abdominal pain/pressure on 08/02/2017. He went to the emergency room but the pain resolved within about 2 hours this time. Again not associated with fever chills nausea or vomiting. Labs were repeated and LFTs were completely normal as was CBC.  He has been advised that his initial upper abdominal pain was likely secondary to passing a gallstone, and that he should be  seen by a surgeon regarding cholecystectomy. Over the past 4-5 days he has not had any recurrent episodes. He says he has general sense that perhaps something is not quite right in his upper abdomen but no episodes of pain or pressure. He has been eating somewhat smaller amounts. He doesn't have an appointment to see Dr. Magnus Ivan on 08/19/2017.  Review of Systems Pertinent positive and negative review of systems were noted in the above HPI section.  All other review of systems was otherwise negative.  Outpatient Encounter Prescriptions as of 08/05/2017  Medication Sig  . Azelaic Acid (FINACEA) 15 % cream Apply topically daily. After skin is thoroughly washed and patted dry, gently but thoroughly massage a thin film of azelaic acid cream into the affected area twice daily, in the morning and evening.  Marland Kitchen azelastine (OPTIVAR) 0.05 % ophthalmic solution Place 1 drop into both eyes 2 (two) times daily.  Marland Kitchen esomeprazole (NEXIUM) 40 MG capsule Take 40 mg by mouth daily before breakfast.  . hydrocortisone (ANUSOL-HC) 25 MG suppository Place 1 suppository (25 mg total) rectally as needed.  . hyoscyamine (ANASPAZ) 0.125 MG TBDP Place 0.125 mg under the tongue.  . hyoscyamine (LEVSIN/SL) 0.125 MG SL tablet Place 1 tablet under the tongue 3 times a day as needed.  . loratadine (CLARITIN) 10 MG tablet Take 10 mg by mouth.  Marland Kitchen LORazepam (ATIVAN) 1 MG tablet 0.5 mg as needed.  . Magnesium 250 MG TABS Take by mouth as needed.  Marland Kitchen  methocarbamol (ROBAXIN) 750 MG tablet Take 500 mg by mouth as needed for muscle spasms.  . Probiotic Product (ALIGN) 4 MG CAPS Take by mouth.  . ranitidine (ZANTAC) 150 MG tablet Take 150 mg by mouth daily.  . Simethicone (PHAZYME) 180 MG CAPS Take by mouth as needed.  . tazarotene (TAZORAC) 0.05 % cream Apply 0.05 % topically at bedtime.  . [DISCONTINUED] cetirizine (ZYRTEC) 10 MG tablet Take 10 mg by mouth daily.  . [DISCONTINUED] metaxalone (SKELAXIN) 800 MG tablet Take 800 mg by mouth  daily.   No facility-administered encounter medications on file as of 08/05/2017.    Allergies  Allergen Reactions  . Penicillins   . Rocephin [Ceftriaxone Sodium In Dextrose]     Blood in stool  . Sulfonamide Derivatives    Patient Active Problem List   Diagnosis Date Noted  . Irritable bowel syndrome (IBS) 02/27/2013  . Diverticulosis of colon without hemorrhage 02/27/2013  . HEMORRHOIDS, INTERNAL 12/20/2008  . GERD 12/17/2008  . DUODENITIS 12/17/2008  . GI BLEEDING 12/17/2008   Social History   Social History  . Marital status: Married    Spouse name: N/A  . Number of children: 3  . Years of education: N/A   Occupational History  . pastor    Social History Main Topics  . Smoking status: Never Smoker  . Smokeless tobacco: Never Used  . Alcohol use Yes     Comment: rarely  . Drug use: No  . Sexual activity: Not on file   Other Topics Concern  . Not on file   Social History Narrative  . No narrative on file    Mr. Inclan family history includes Colon cancer in his maternal aunt; Heart disease in his father and mother; Prostate cancer in his paternal aunt.      Objective:    Vitals:   08/05/17 1055  BP: 100/70  Pulse: 80    Physical Exam well-developed older white male in no acute distress, very pleasant blood pressure 100/70 pulse 80, BMI 23.0. HEENT; nontraumatic normocephalic EOMI PERRLA sclera anicteric, Cardiovascular; regular rate and rhythm with S1-S2, systolic murmur, Pulmonary; clear bilaterally, Abdomen ;soft, nontender nondistended bowel sounds are active there is no palpable mass or hepatosplenomegaly bowel sounds present, Rectal ;exam not done, Extremities ;no clubbing cyanosis or edema skin warm and dry, Neuropsych ;mood and affect appropriate       Assessment & Plan:   #66 66 year old white male with 2 recent episodes of epigastric pain and pressure, the last about 4 days ago. 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 Patient had elevated LFTs on 66/07/2017, completely normal after a similar milder episode on 08/02/2017. No current evidence for choledocholithiasis., Though he may have passed a small stone or sludge on 07/18/2017 #2 bilateral ureterolithiasis, patient passed one stone on August 21 #3 GERD #4 history of IBS #5 diverticulosis #6 mitral valve prolapse plan  #7 hypertension #8 colon cancer surveillance last colonoscopy 2013, no polyps indicated for 10 year interval follow-up  Plan; low-fat diet with smaller meals Patient will keep surgical consult with Dr. Magnus Ivan which is scheduled for 08/19/2017. Repeat hepatic panel today He was advised that should he have another severe episode of upper abdominal pain or associated nausea vomiting or fever that he should seek ER evaluation here in [place] as he may require admission for cholecystectomy. We discussed the possibility that it may have passed a small stone or sludge from his gallbladder  and that IOC would be done at the time of cholecystectomy, to confirm that there are no common bile duct stones. We briefly discussed ERCP which is not indicated at this time. Patient would like to be established with Dr. Yancey Flemings. Follow-up colonoscopy 2023  Sammuel Cooper PA-C 08/05/2017   Cc: Cheral Bay, MD

## 2017-08-08 NOTE — Progress Notes (Signed)
Initial assessment and plans reviewed 

## 2017-08-19 ENCOUNTER — Encounter (HOSPITAL_BASED_OUTPATIENT_CLINIC_OR_DEPARTMENT_OTHER): Payer: Self-pay | Admitting: *Deleted

## 2017-08-19 ENCOUNTER — Other Ambulatory Visit: Payer: Self-pay | Admitting: Surgery

## 2017-08-21 NOTE — H&P (Signed)
Raymond RoachGary B Wood 08/19/2017 10:08 AM Location: [facility] Patient #: [id number] DOB: 09/01/1951 Married / Language: [place] / Race: White Male   History of Present Illness (Raymond Trimm [name] MD; 08/19/2017 10:41 AM) The patient is a 66 year old male who presents for evaluation of gall stones. This is a pleasant gentleman referred to me by Dr. Fredia BeetsAl Wood for evaluation of symptomatic cholelithiasis. Back in August, he had an episode of epigastric abdominal pain that lasted for 12 hours. He described the pain as sharp and burning. The pain then recurred 2 days later and he had to present to the emergency department. He had elevated liver function tests. He had a CT scan that also showed a obstructing kidney stone. The stone passed. I suspect he also passed a gallstone. He then felt better. He has since followed up with gastroenterology and saw Raymond GipAmy Esterwood Wood. He now feels well and has been avoiding fatty meals. He denied any jaundice during the attack. I have the results of his CT scan and liver function tests which I reviewed. At the time, his bilirubin was 1.4.   Past Surgical History Raymond Wood(Raymond Wood, [place]; 08/19/2017 10:08 AM) Colon Polyp Removal - Colonoscopy  Oral Surgery   Diagnostic Studies History Raymond Wood(Raymond Wood, [place]; 08/19/2017 10:08 AM) Colonoscopy  1-5 years ago  Allergies Raymond Wood(Raymond Wood, [place]; 08/19/2017 10:09 AM) No Known Drug Allergies 08/19/2017  Medication History Raymond Wood(Raymond Wood, RMA; 08/19/2017 10:12 AM) LORazepam (1MG  Tablet, Oral) Active. Finacea (15% Gel, External) Active. Methocarbamol (500MG  Tablet, Oral) Active. Claritin (10MG  Capsule, Oral) Active. RaNITidine HCl (150MG  Tablet, Oral) Active. Fluticasone Propionate (50MCG/ACT Suspension, Nasal) Active. Azelastine HCl (0.05% Solution, Ophthalmic) Active. Medications Reconciled  Social History Raymond Wood(Raymond Wood, [place]; 08/19/2017 10:08 AM) Alcohol use  Occasional alcohol use. Caffeine use   Carbonated beverages, Coffee. No drug use  Tobacco use  Never smoker.  Family History Raymond Wood(Raymond Wood, [place]; 08/19/2017 10:08 AM) Arthritis  Mother. Diabetes Mellitus  Father. Heart Disease  Father, Mother. Heart disease in male family member before age 66   Other Problems Raymond Wood(Raymond Wood, [place]; 08/19/2017 10:08 AM) Arthritis  Back Pain  Cholelithiasis  Diverticulosis  Gastric Ulcer  Gastroesophageal Reflux Disease  Heart murmur  Hemorrhoids  Hypercholesterolemia  Kidney Stone  Umbilical Hernia Repair     Review of Systems Raymond Wood(Raymond Wood RMA; 08/19/2017 10:09 AM) General Present- Weight Loss. Not Present- Appetite Loss, Chills, Fatigue, Fever, Night Sweats and Weight Gain. Skin Not Present- Change in Wart/Mole, Dryness, Hives, Jaundice, New Lesions, Non-Healing Wounds, Rash and Ulcer. HEENT Present- Earache, Seasonal Allergies and Wears glasses/contact lenses. Not Present- Hearing Loss, Hoarseness, Nose Bleed, Oral Ulcers, Ringing in the Ears, Sinus Pain, Sore Throat, Visual Disturbances and Yellow Eyes. Respiratory Not Present- Bloody sputum, Chronic Cough, Difficulty Breathing, Snoring and Wheezing. Breast Not Present- Breast Mass, Breast Pain, Nipple Discharge and Skin Changes. Cardiovascular Present- Leg Cramps. Not Present- Chest Pain, Difficulty Breathing Lying Down, Palpitations, Rapid Heart Rate, Shortness of Breath and Swelling of Extremities. Gastrointestinal Present- Bloating, Excessive gas, Hemorrhoids and Indigestion. Not Present- Abdominal Pain, Bloody Stool, Change in Bowel Habits, Chronic diarrhea, Constipation, Difficulty Swallowing, Gets full quickly at meals, Nausea, Rectal Pain and Vomiting. Male Genitourinary Not Present- Blood in Urine, Change in Urinary Stream, Frequency, Impotence, Nocturia, Painful Urination, Urgency and Urine Leakage. Musculoskeletal Not Present- Back Pain, Joint Pain, Joint Stiffness, Muscle Pain, Muscle Weakness and Swelling of  Extremities. Neurological Not Present- Decreased Memory, Fainting, Headaches, Numbness, Seizures, Tingling, Tremor, Trouble walking and Weakness. Psychiatric Not Present-  Anxiety, Bipolar, Change in Sleep Pattern, Depression, Fearful and Frequent crying. Endocrine Not Present- Cold Intolerance, Excessive Hunger, Hair Changes, Heat Intolerance, Hot flashes and New Diabetes. Hematology Not Present- Blood Thinners, Easy Bruising, Excessive bleeding, Gland problems, HIV and Persistent Infections.  Vitals Raymond Wood RMA; 08/19/2017 10:12 AM) 08/19/2017 10:12 AM Weight: 165.8 lb Height: 71in Body Surface Area: 1.95 m Body Mass Index: 23.12 kg/m  Temp.: 98.60F  Pulse: 97 (Regular)  BP: 115/70 (Sitting, Left Arm, Standard)       Physical Exam (Raymond Cisar A. Magnus Ivan MD; 08/19/2017 10:42 AM) General Mental Status-Alert. General Appearance-Consistent with stated age. Hydration-Well hydrated. Voice-Normal.  Head and Neck Head-normocephalic, atraumatic with no lesions or palpable masses.  Eye Eyeball - Bilateral-Extraocular movements intact. Sclera/Conjunctiva - Bilateral-No scleral icterus.  Chest and Lung Exam Chest and lung exam reveals -quiet, even and easy respiratory effort with no use of accessory muscles and on auscultation, normal breath sounds, no adventitious sounds and normal vocal resonance. Inspection Chest Wall - Normal. Back - normal.  Cardiovascular Cardiovascular examination reveals -on palpation PMI is normal in location and amplitude, no palpable S3 or S4. Normal cardiac borders., carotid auscultation reveals no bruits and normal pedal pulses bilaterally. Auscultation Murmurs & Other Heart Sounds - Auscultation of the heart reveals - Note: He has a murmur.  Abdomen Inspection Inspection of the abdomen reveals - No Hernias. Skin - Scar - no surgical scars. Palpation/Percussion Palpation and Percussion of the abdomen reveal - Soft, Non  Tender, No Rebound tenderness, No Rigidity (guarding) and No hepatosplenomegaly. Auscultation Auscultation of the abdomen reveals - Bowel sounds normal.  Neurologic - Did not examine.  Musculoskeletal - Did not examine.    Assessment & Plan (Dorena Dorfman A. Magnus Ivan MD; 08/19/2017 10:43 AM) SYMPTOMATIC CHOLELITHIASIS (K80.20) Impression: I discussed the diagnosis with him in detail. I believe this is a significant gallbladder attack and that he temporarily had a stone in the bile duct which I suspect he has passed. A laparoscopic cholecystectomy with cholangiogram is recommended. I gave him literature regarding this. We discussed the surgical procedure in detail. I discussed the risk of the surgery which includes but is not limited to bleeding, infection, injury to surrounding structures, the need to convert to an open procedure, bile duct injury, bile leak, cardiopulmonary issues, postoperative recovery, etc. He understands and wished to proceed with surgery which will be scheduled

## 2017-08-22 ENCOUNTER — Ambulatory Visit (HOSPITAL_BASED_OUTPATIENT_CLINIC_OR_DEPARTMENT_OTHER): Payer: Medicare Other | Admitting: Certified Registered"

## 2017-08-22 ENCOUNTER — Ambulatory Visit (HOSPITAL_COMMUNITY): Payer: Medicare Other

## 2017-08-22 ENCOUNTER — Encounter (HOSPITAL_BASED_OUTPATIENT_CLINIC_OR_DEPARTMENT_OTHER)
Admission: RE | Disposition: A | Discharge status: Home / Self Care | Payer: Self-pay | Source: Ambulatory Visit | Attending: Surgery

## 2017-08-22 ENCOUNTER — Encounter (HOSPITAL_BASED_OUTPATIENT_CLINIC_OR_DEPARTMENT_OTHER): Payer: Self-pay | Admitting: Certified Registered"

## 2017-08-22 ENCOUNTER — Ambulatory Visit (HOSPITAL_BASED_OUTPATIENT_CLINIC_OR_DEPARTMENT_OTHER)
Admission: RE | Admit: 2017-08-22 | Discharge: 2017-08-22 | Disposition: A | Discharge status: Home / Self Care | Payer: Medicare Other | Source: Ambulatory Visit | Attending: Surgery | Admitting: Surgery

## 2017-08-22 DIAGNOSIS — Z419 Encounter for procedure for purposes other than remedying health state, unspecified: Secondary | ICD-10-CM

## 2017-08-22 DIAGNOSIS — Z7951 Long term (current) use of inhaled steroids: Secondary | ICD-10-CM | POA: Diagnosis not present

## 2017-08-22 DIAGNOSIS — K8020 Calculus of gallbladder without cholecystitis without obstruction: Secondary | ICD-10-CM | POA: Diagnosis present

## 2017-08-22 DIAGNOSIS — K219 Gastro-esophageal reflux disease without esophagitis: Secondary | ICD-10-CM | POA: Insufficient documentation

## 2017-08-22 DIAGNOSIS — Z79899 Other long term (current) drug therapy: Secondary | ICD-10-CM | POA: Diagnosis not present

## 2017-08-22 DIAGNOSIS — K8010 Calculus of gallbladder with chronic cholecystitis without obstruction: Secondary | ICD-10-CM | POA: Insufficient documentation

## 2017-08-22 HISTORY — DX: Unspecified osteoarthritis, unspecified site: M19.90

## 2017-08-22 HISTORY — DX: Other seasonal allergic rhinitis: J30.2

## 2017-08-22 HISTORY — DX: Nonrheumatic mitral (valve) prolapse: I34.1

## 2017-08-22 HISTORY — DX: Personal history of other diseases of the digestive system: Z87.19

## 2017-08-22 HISTORY — PX: CHOLECYSTECTOMY: SHX55

## 2017-08-22 SURGERY — LAPAROSCOPIC CHOLECYSTECTOMY WITH INTRAOPERATIVE CHOLANGIOGRAM
Anesthesia: General | Site: Abdomen

## 2017-08-22 MED ORDER — MIDAZOLAM HCL 2 MG/2ML IJ SOLN: 1.0000 mg | INTRAMUSCULAR | Status: DC | PRN

## 2017-08-22 MED ORDER — FENTANYL CITRATE (PF) 100 MCG/2ML IJ SOLN
50.0000 ug | INTRAMUSCULAR | Status: DC | PRN
  Administered 2017-08-22: 50 ug via INTRAVENOUS

## 2017-08-22 MED ORDER — LIDOCAINE HCL (CARDIAC) 20 MG/ML IV SOLN
INTRAVENOUS | Status: DC | PRN
  Administered 2017-08-22: 60 mg via INTRAVENOUS

## 2017-08-22 MED ORDER — EPHEDRINE SULFATE 50 MG/ML IJ SOLN
INTRAMUSCULAR | Status: DC | PRN
  Administered 2017-08-22: 10 mg via INTRAVENOUS

## 2017-08-22 MED ORDER — KETOROLAC TROMETHAMINE 30 MG/ML IJ SOLN
INTRAMUSCULAR | Status: DC | PRN
  Administered 2017-08-22: 30 mg via INTRAVENOUS

## 2017-08-22 MED ORDER — CHLORHEXIDINE GLUCONATE CLOTH 2 % EX PADS: 6.0000 | MEDICATED_PAD | Freq: Once | CUTANEOUS | Status: DC

## 2017-08-22 MED ORDER — HYDROMORPHONE HCL 1 MG/ML IJ SOLN
0.2500 mg | INTRAMUSCULAR | Status: DC | PRN
  Administered 2017-08-22 (×2): 0.5 mg via INTRAVENOUS

## 2017-08-22 MED ORDER — ONDANSETRON HCL 4 MG/2ML IJ SOLN
INTRAMUSCULAR | Status: DC | PRN
  Administered 2017-08-22: 4 mg via INTRAVENOUS

## 2017-08-22 MED ORDER — SODIUM CHLORIDE 0.9 % IR SOLN
Status: DC | PRN
  Administered 2017-08-22: 1000 mL

## 2017-08-22 MED ORDER — SCOPOLAMINE 1 MG/3DAYS TD PT72
MEDICATED_PATCH | TRANSDERMAL | Status: AC
  Filled 2017-08-22: qty 1

## 2017-08-22 MED ORDER — HYDROMORPHONE HCL 1 MG/ML IJ SOLN
INTRAMUSCULAR | Status: AC
  Filled 2017-08-22: qty 0.5

## 2017-08-22 MED ORDER — OXYCODONE HCL 5 MG/5ML PO SOLN: 5.0000 mg | Freq: Once | ORAL | Status: DC | PRN

## 2017-08-22 MED ORDER — OXYCODONE HCL 5 MG PO TABS: 5.0000 mg | ORAL_TABLET | Freq: Four times a day (QID) | ORAL | 0 refills | Status: DC | PRN

## 2017-08-22 MED ORDER — PROPOFOL 10 MG/ML IV BOLUS
INTRAVENOUS | Status: DC | PRN
  Administered 2017-08-22: 200 mg via INTRAVENOUS

## 2017-08-22 MED ORDER — OXYCODONE HCL 5 MG PO TABS: 5.0000 mg | ORAL_TABLET | Freq: Once | ORAL | Status: DC | PRN

## 2017-08-22 MED ORDER — ONDANSETRON HCL 4 MG/2ML IJ SOLN: 4.0000 mg | Freq: Four times a day (QID) | INTRAMUSCULAR | Status: DC | PRN

## 2017-08-22 MED ORDER — DEXAMETHASONE SODIUM PHOSPHATE 4 MG/ML IJ SOLN
INTRAMUSCULAR | Status: DC | PRN
  Administered 2017-08-22: 10 mg via INTRAVENOUS

## 2017-08-22 MED ORDER — LACTATED RINGERS IV SOLN
INTRAVENOUS | Status: DC
  Administered 2017-08-22: 10 mL/h via INTRAVENOUS
  Administered 2017-08-22: 09:00:00 via INTRAVENOUS

## 2017-08-22 MED ORDER — FENTANYL CITRATE (PF) 100 MCG/2ML IJ SOLN
INTRAMUSCULAR | Status: AC
  Filled 2017-08-22: qty 2

## 2017-08-22 MED ORDER — IOPAMIDOL (ISOVUE-300) INJECTION 61%
INTRAVENOUS | Status: DC | PRN
  Administered 2017-08-22: 15 mL

## 2017-08-22 MED ORDER — SUGAMMADEX SODIUM 500 MG/5ML IV SOLN
INTRAVENOUS | Status: DC | PRN
  Administered 2017-08-22: 30 mg via INTRAVENOUS

## 2017-08-22 MED ORDER — CIPROFLOXACIN IN D5W 400 MG/200ML IV SOLN
400.0000 mg | INTRAVENOUS | Status: AC
  Administered 2017-08-22: 400 mg via INTRAVENOUS

## 2017-08-22 MED ORDER — SCOPOLAMINE 1 MG/3DAYS TD PT72
1.0000 | MEDICATED_PATCH | Freq: Once | TRANSDERMAL | Status: DC | PRN
  Administered 2017-08-22: 1.5 mg via TRANSDERMAL

## 2017-08-22 MED ORDER — BUPIVACAINE-EPINEPHRINE (PF) 0.5% -1:200000 IJ SOLN
INTRAMUSCULAR | Status: DC | PRN
  Administered 2017-08-22: 10 mL

## 2017-08-22 MED ORDER — CIPROFLOXACIN IN D5W 400 MG/200ML IV SOLN
INTRAVENOUS | Status: AC
  Filled 2017-08-22: qty 200

## 2017-08-22 MED ORDER — ROCURONIUM BROMIDE 100 MG/10ML IV SOLN
INTRAVENOUS | Status: DC | PRN
  Administered 2017-08-22: 50 mg via INTRAVENOUS

## 2017-08-22 SURGICAL SUPPLY — 38 items
ADH SKN CLS APL DERMABOND .7 (GAUZE/BANDAGES/DRESSINGS) ×1
APPLIER CLIP 5 13 M/L LIGAMAX5 (MISCELLANEOUS) ×2
APR CLP MED LRG 5 ANG JAW (MISCELLANEOUS) ×1
BAG SPEC RTRVL LRG 6X4 10 (ENDOMECHANICALS) ×1
BLADE CLIPPER SURG (BLADE) ×1 IMPLANT
CHLORAPREP W/TINT 26ML (MISCELLANEOUS) ×2 IMPLANT
CLIP APPLIE 5 13 M/L LIGAMAX5 (MISCELLANEOUS) ×1 IMPLANT
COVER MAYO STAND STRL (DRAPES) ×1 IMPLANT
DECANTER SPIKE VIAL GLASS SM (MISCELLANEOUS) IMPLANT
DERMABOND ADVANCED (GAUZE/BANDAGES/DRESSINGS) ×1
DERMABOND ADVANCED .7 DNX12 (GAUZE/BANDAGES/DRESSINGS) ×1 IMPLANT
DRAPE C-ARM 42X72 X-RAY (DRAPES) ×1 IMPLANT
DRAPE LAPAROSCOPIC ABDOMINAL (DRAPES) ×2 IMPLANT
ELECT REM PT RETURN 9FT ADLT (ELECTROSURGICAL) ×2
ELECTRODE REM PT RTRN 9FT ADLT (ELECTROSURGICAL) ×1 IMPLANT
FILTER SMOKE EVAC LAPAROSHD (FILTER) IMPLANT
GLOVE SURG SIGNA 7.5 PF LTX (GLOVE) ×2 IMPLANT
GOWN STRL REUS W/ TWL LRG LVL3 (GOWN DISPOSABLE) ×2 IMPLANT
GOWN STRL REUS W/ TWL XL LVL3 (GOWN DISPOSABLE) ×1 IMPLANT
GOWN STRL REUS W/TWL LRG LVL3 (GOWN DISPOSABLE) ×4
GOWN STRL REUS W/TWL XL LVL3 (GOWN DISPOSABLE) ×2
HEMOSTAT SNOW SURGICEL 2X4 (HEMOSTASIS) ×2 IMPLANT
PACK BASIN DAY SURGERY FS (CUSTOM PROCEDURE TRAY) ×2 IMPLANT
POUCH SPECIMEN RETRIEVAL 10MM (ENDOMECHANICALS) ×2 IMPLANT
SCISSORS LAP 5X35 DISP (ENDOMECHANICALS) IMPLANT
SET CHOLANGIOGRAPH 5 50 .035 (SET/KITS/TRAYS/PACK) ×1 IMPLANT
SET IRRIG TUBING LAPAROSCOPIC (IRRIGATION / IRRIGATOR) ×2 IMPLANT
SLEEVE ENDOPATH XCEL 5M (ENDOMECHANICALS) ×4 IMPLANT
SLEEVE SCD COMPRESS KNEE MED (MISCELLANEOUS) ×2 IMPLANT
SPECIMEN JAR SMALL (MISCELLANEOUS) ×2 IMPLANT
SUT MON AB 4-0 PC3 18 (SUTURE) ×2 IMPLANT
SUT VICRYL 0 UR6 27IN ABS (SUTURE) IMPLANT
TOWEL OR 17X24 6PK STRL BLUE (TOWEL DISPOSABLE) ×2 IMPLANT
TRAY LAPAROSCOPIC (CUSTOM PROCEDURE TRAY) ×2 IMPLANT
TROCAR XCEL BLUNT TIP 100MML (ENDOMECHANICALS) ×2 IMPLANT
TROCAR XCEL NON-BLD 5MMX100MML (ENDOMECHANICALS) ×2 IMPLANT
TUBE CONNECTING 20X1/4 (TUBING) ×2 IMPLANT
TUBING INSUFFLATION (TUBING) ×2 IMPLANT

## 2017-08-22 NOTE — Transfer of Care (Signed)
Immediate Anesthesia Transfer of Care Note  Patient: Raymond Wood  Procedure(s) Performed: Procedure(s): LAPAROSCOPIC CHOLECYSTECTOMY WITH INTRAOPERATIVE CHOLANGIOGRAM (N/A)  Patient Location: PACU  Anesthesia Type:General  Level of Consciousness: awake and patient cooperative  Airway & Oxygen Therapy: Patient Spontanous Breathing and Patient connected to face mask oxygen  Post-op Assessment: Report given to RN and Post -op Vital signs reviewed and stable  Post vital signs: Reviewed and stable  Last Vitals:  Vitals:   08/22/17 0836  BP: 138/75  Pulse: 69  Resp: 18  Temp: 36.8 C  SpO2: 100%    Last Pain:  Vitals:   08/22/17 0836  TempSrc: Oral  PainSc: 0-No pain         Complications: No apparent anesthesia complications

## 2017-08-22 NOTE — Anesthesia Preprocedure Evaluation (Signed)
Anesthesia Evaluation  Patient identified by MRN, date of birth, ID band Patient awake    Reviewed: Allergy & Precautions, H&P , NPO status , Patient's Chart, lab work & pertinent test results  Airway Mallampati: II   Neck ROM: full    Dental   Pulmonary neg pulmonary ROS,    breath sounds clear to auscultation       Cardiovascular + dysrhythmias  Rhythm:regular Rate:Normal  RBBB   Neuro/Psych    GI/Hepatic hiatal hernia, GERD  ,gallstones   Endo/Other    Renal/GU stones     Musculoskeletal  (+) Arthritis ,   Abdominal   Peds  Hematology   Anesthesia Other Findings   Reproductive/Obstetrics                             Anesthesia Physical Anesthesia Plan  ASA: II  Anesthesia Plan: General   Post-op Pain Management:    Induction: Intravenous  PONV Risk Score and Plan: 3 and Ondansetron, Dexamethasone, Midazolam and Treatment may vary due to age or medical condition  Airway Management Planned: Oral ETT  Additional Equipment:   Intra-op Plan:   Post-operative Plan: Extubation in OR  Informed Consent: I have reviewed the patients History and Physical, chart, labs and discussed the procedure including the risks, benefits and alternatives for the proposed anesthesia with the patient or authorized representative who has indicated his/her understanding and acceptance.     Plan Discussed with: CRNA, Anesthesiologist and Surgeon  Anesthesia Plan Comments:         Anesthesia Quick Evaluation

## 2017-08-22 NOTE — Anesthesia Postprocedure Evaluation (Signed)
Anesthesia Post Note  Patient: Concha PyoGary B Whicker  Procedure(s) Performed: Procedure(s) (LRB): LAPAROSCOPIC CHOLECYSTECTOMY WITH INTRAOPERATIVE CHOLANGIOGRAM (N/A)     Patient location during evaluation: PACU Anesthesia Type: General Level of consciousness: awake and alert Pain management: pain level controlled Vital Signs Assessment: post-procedure vital signs reviewed and stable Respiratory status: spontaneous breathing, nonlabored ventilation, respiratory function stable and patient connected to nasal cannula oxygen Cardiovascular status: blood pressure returned to baseline and stable Postop Assessment: no signs of nausea or vomiting Anesthetic complications: no    Last Vitals:  Vitals:   08/22/17 1145 08/22/17 1200  BP: 117/72 116/76  Pulse: 70 69  Resp: 17 (!) 9  Temp:    SpO2: 100% 97%    Last Pain:  Vitals:   08/22/17 1200  TempSrc:   PainSc: 2                  Laurian Edrington S

## 2017-08-22 NOTE — Anesthesia Procedure Notes (Signed)
Procedure Name: Intubation Date/Time: 08/22/2017 9:50 AM Performed by: Valoria Tamburri D Pre-anesthesia Checklist: Patient identified, Emergency Drugs available, Suction available and Patient being monitored Patient Re-evaluated:Patient Re-evaluated prior to induction Oxygen Delivery Method: Circle system utilized Preoxygenation: Pre-oxygenation with 100% oxygen Induction Type: IV induction Ventilation: Mask ventilation without difficulty Laryngoscope Size: Mac and 3 Grade View: Grade I Tube type: Oral Tube size: 7.0 mm Number of attempts: 1 Airway Equipment and Method: Stylet and Oral airway Placement Confirmation: ETT inserted through vocal cords under direct vision,  positive ETCO2 and breath sounds checked- equal and bilateral Secured at: 22 cm Tube secured with: Tape Dental Injury: Teeth and Oropharynx as per pre-operative assessment

## 2017-08-22 NOTE — Op Note (Signed)
Laparoscopic Cholecystectomy with IOC Procedure Note  Indications: This patient presents with symptomatic gallbladder disease and will undergo laparoscopic cholecystectomy.  Pre-operative Diagnosis: symptomatic cholelthiasis  Post-operative Diagnosis: Same  Surgeon: Abigail Miyamoto A   Assistants: none  Anesthesia: General endotracheal anesthesia  ASA Class: 2  Procedure Details  The patient was seen again in the Holding Room. The risks, benefits, complications, treatment options, and expected outcomes were discussed with the patient. The possibilities of reaction to medication, pulmonary aspiration, perforation of viscus, bleeding, recurrent infection, finding a normal gallbladder, the need for additional procedures, failure to diagnose a condition, the possible need to convert to an open procedure, and creating a complication requiring transfusion or operation were discussed with the patient. The likelihood of improving the patient's symptoms with return to their baseline status is good.  The patient and/or family concurred with the proposed plan, giving informed consent. The site of surgery properly noted. The patient was taken to Operating Room, identified as Raymond Wood and the procedure verified as Laparoscopic Cholecystectomy with Intraoperative Cholangiogram. A Time Out was held and the above information confirmed.  Prior to the induction of general anesthesia, antibiotic prophylaxis was administered. General endotracheal anesthesia was then administered and tolerated well. After the induction, the abdomen was prepped with Chloraprep and draped in the sterile fashion. The patient was positioned in the supine position.  Local anesthetic agent was injected into the skin near the umbilicus and an incision made. We dissected down to the abdominal fascia with blunt dissection.  The fascia was incised vertically and we entered the peritoneal cavity bluntly.  A pursestring suture of 0-Vicryl  was placed around the fascial opening.  The Hasson cannula was inserted and secured with the stay suture.  Pneumoperitoneum was then created with CO2 and tolerated well without any adverse changes in the patient's vital signs. An 11-mm port was placed in the subxiphoid position.  Two 5-mm ports were placed in the right upper quadrant. All skin incisions were infiltrated with a local anesthetic agent before making the incision and placing the trocars.   We positioned the patient in reverse Trendelenburg, tilted slightly to the patient's left.  The gallbladder was identified, the fundus grasped and retracted cephalad. Adhesions were lysed bluntly and with the electrocautery where indicated, taking care not to injure any adjacent organs or viscus. The infundibulum was grasped and retracted laterally, exposing the peritoneum overlying the triangle of Calot. This was then divided and exposed in a blunt fashion. A critical view of the cystic duct and cystic artery was obtained.  The cystic duct was clearly identified and bluntly dissected circumferentially. The cystic duct was ligated with a clip distally.   An incision was made in the cystic duct and the [facility] cholangiogram catheter introduced. The catheter was secured using a clip. A cholangiogram was then obtained which showed good visualization of the distal and proximal biliary tree with no sign of filling defects or obstruction.  Contrast flowed easily into the duodenum. The catheter was then removed.   The cystic duct was then ligated with clips and divided. The cystic artery was identified, dissected free, ligated with clips and divided as well.   The gallbladder was dissected from the liver bed in retrograde fashion with the electrocautery. The gallbladder was removed and placed in an Endocatch sac. The liver bed was irrigated and inspected. Hemostasis was achieved with the electrocautery. Copious irrigation was utilized and was repeatedly aspirated until  clear.  The gallbladder and Endocatch sac were then  removed through the umbilical port site.  The pursestring suture was used to close the umbilical fascia.    We again inspected the right upper quadrant for hemostasis.  Pneumoperitoneum was released as we removed the trocars.  4-0 Monocryl was used to close the skin.   Benzoin, steri-strips, and clean dressings were applied. The patient was then extubated and brought to the recovery room in stable condition. Instrument, sponge, and needle counts were correct at closure and at the conclusion of the case.   Findings: Chronic Cholecystitis with Cholelithiasis Normal cholangiogram  Estimated Blood Loss: Minimal         Drains: 0         Specimens: Gallbladder           Complications: None; patient tolerated the procedure well.         Disposition: PACU - hemodynamically stable.         Condition: stable

## 2017-08-22 NOTE — Discharge Instructions (Signed)
[facility] ______[facility] LAPAROSCOPIC SURGERY: POST OP INSTRUCTIONS Always review your discharge instruction sheet given to you by the facility where your surgery was performed. IF YOU HAVE DISABILITY OR FAMILY LEAVE FORMS, YOU MUST BRING THEM TO THE OFFICE FOR PROCESSING.   DO NOT GIVE THEM TO YOUR DOCTOR.  1. A prescription for pain medication may be given to you upon discharge.  Take your pain medication as prescribed, if needed.  If narcotic pain medicine is not needed, then you may take acetaminophen (Tylenol) or ibuprofen (Advil) as needed. 2. Take your usually prescribed medications unless otherwise directed. 3. If you need a refill on your pain medication, please contact your pharmacy.  They will contact our office to request authorization. Prescriptions will not be filled after 5pm or on week-ends. 4. You should follow a light diet the first few days after arrival home, such as soup and crackers, etc.  Be sure to include lots of fluids daily. 5. Most patients will experience some swelling and bruising in the area of the incisions.  Ice packs will help.  Swelling and bruising can take several days to resolve.  6. It is common to experience some constipation if taking pain medication after surgery.  Increasing fluid intake and taking a stool softener (such as Colace) will usually help or prevent this problem from occurring.  A mild laxative (Milk of Magnesia or Miralax) should be taken according to package instructions if there are no bowel movements after 48 hours. 7. Unless discharge instructions indicate otherwise, you may remove your bandages 24-48 hours after surgery, and you may shower at that time.  You may have steri-strips (small skin tapes) in place directly over the incision.  These strips should be left on the skin for 7-10 days.  If your surgeon used skin glue on the incision, you may shower in 24 hours.  The glue will flake off over the next 2-3 weeks.  Any sutures or  staples will be removed at the office during your follow-up visit. 8. ACTIVITIES:  You may resume regular (light) daily activities beginning the next day--such as daily self-care, walking, climbing stairs--gradually increasing activities as tolerated.  You may have sexual intercourse when it is comfortable.  Refrain from any heavy lifting or straining until approved by your doctor. a. You may drive when you are no longer taking prescription pain medication, you can comfortably wear a seatbelt, and you can safely maneuver your car and apply brakes. b. RETURN TO WORK:  __________________________________________________________ 9. You should see your doctor in the office for a follow-up appointment approximately 2-3 weeks after your surgery.  Make sure that you call for this appointment within a day or two after you arrive home to insure a convenient appointment time. 10. OTHER INSTRUCTIONS: OK TO SHOWER STARTING TOMORROW 11. ICE PACK AND IBUPROFEN AND TYLENOL ALSO FOR PAIN 12. NO LIFTING MORE THAN 15 POUNDS FOR 2 WEEKS __________________________________________________________________________________________________________________________ __________________________________________________________________________________________________________________________ WHEN TO CALL YOUR DOCTOR: 1. Fever over 101.0 2. Inability to urinate 3. Continued bleeding from incision. 4. Increased pain, redness, or drainage from the incision. 5. Increasing abdominal pain  The clinic staff is available to answer your questions during regular business hours.  Please dont hesitate to call and ask to speak to one of the nurses for clinical concerns.  If you have a medical emergency, go to the nearest emergency room or call 911.  A surgeon from [facility] is always on call at the hospital. [address],  [address], [place], [place]  [address] ? [address] [address], [place], [place]   [phone] ?  [phone] ? FAX [phone] Web site: [email or web address]         Post Anesthesia Home Care Instructions  Activity: Get plenty of rest for the remainder of the day. A responsible individual must stay with you for 24 hours following the procedure.  For the next 24 hours, DO NOT: -Drive a car -Advertising copywriter -Drink alcoholic beverages -Take any medication unless instructed by your physician -Make any legal decisions or sign important papers.  Meals: Start with liquid foods such as gelatin or soup. Progress to regular foods as tolerated. Avoid greasy, spicy, heavy foods. If nausea and/or vomiting occur, drink only clear liquids until the nausea and/or vomiting subsides. Call your physician if vomiting continues.  Special Instructions/Symptoms: Your throat may feel dry or sore from the anesthesia or the breathing tube placed in your throat during surgery. If this causes discomfort, gargle with warm salt water. The discomfort should disappear within 24 hours.  If you had a scopolamine patch placed behind your ear for the management of post- operative nausea and/or vomiting:  1. The medication in the patch is effective for 72 hours, after which it should be removed.  Wrap patch in a tissue and discard in the trash. Wash hands thoroughly with soap and water. 2. You may remove the patch earlier than 72 hours if you experience unpleasant side effects which may include dry mouth, dizziness or visual disturbances. 3. Avoid touching the patch. Wash your hands with soap and water after contact with the patch.

## 2017-08-22 NOTE — Interval H&P Note (Signed)
History and Physical Interval Note: no change in H and P  08/22/2017 9:26 AM  Raymond RoachGary B Wood  has presented today for surgery, with the diagnosis of CHOLELITHIASIS  The various methods of treatment have been discussed with the patient and family. After consideration of risks, benefits and other options for treatment, the patient has consented to  Procedure(s): LAPAROSCOPIC CHOLECYSTECTOMY WITH INTRAOPERATIVE CHOLANGIOGRAM (N/A) as a surgical intervention .  The patient's history has been reviewed, patient examined, no change in status, stable for surgery.  I have reviewed the patient's chart and labs.  Questions were answered to the patient's satisfaction.     Sigifredo Pignato A

## 2017-08-23 ENCOUNTER — Encounter (HOSPITAL_BASED_OUTPATIENT_CLINIC_OR_DEPARTMENT_OTHER): Payer: Self-pay | Admitting: Surgery

## 2018-09-13 ENCOUNTER — Ambulatory Visit (INDEPENDENT_AMBULATORY_CARE_PROVIDER_SITE_OTHER): Payer: Medicare Other | Admitting: Physician Assistant

## 2018-09-13 ENCOUNTER — Encounter: Payer: Self-pay | Admitting: Physician Assistant

## 2018-09-13 VITALS — BP 120/66 | HR 76 | Ht 71.0 in | Wt 169.9 lb

## 2018-09-13 DIAGNOSIS — R140 Abdominal distension (gaseous): Secondary | ICD-10-CM

## 2018-09-13 DIAGNOSIS — K589 Irritable bowel syndrome without diarrhea: Secondary | ICD-10-CM

## 2018-09-13 MED ORDER — RIFAXIMIN 550 MG PO TABS: ORAL_TABLET | ORAL | 0 refills | Status: DC

## 2018-09-13 NOTE — Patient Instructions (Signed)
We sent a prescription to [facility], [place].   1. Xifaxan 550 mg-  They will call you with the price after they run your insurance.   If the price is agreeable to you, they will mail you the medication.  We have provided you with a Low Gas Diet handout.  Avoid artificial sweetners. Avoid Lactose- try lactose free creamer.   Follow up in 6-8 weeks as needed.  Please call us with any problems or questions.  Normal BMI (Body Mass Index- based on height and weight) is between 23 and 30. Your BMI today is Body mass index is 23.7 kg/m. Marland Kitchen Please consider follow up  regarding your BMI with your Primary Care Provider.

## 2018-09-14 ENCOUNTER — Encounter: Payer: Self-pay | Admitting: Physician Assistant

## 2018-09-14 ENCOUNTER — Telehealth: Payer: Self-pay | Admitting: *Deleted

## 2018-09-14 NOTE — Progress Notes (Signed)
Subjective:    Patient ID: Raymond Wood, male    DOB: 1951-01-13, 67 y.o.   MRN: [id number]  HPI Nicholus is a pleasant 67 year old white male, established with Dr. Marina Goodell, who was last seen in our office in August 2018 by myself.  At that time he was diagnosed with gallbladder disease/biliary colic and underwent laparoscopic cholecystectomy December 2018. Last colonoscopy 11/2012 with mild sigmoid diverticulosis, there were no polyps and he was indicated for 10-year interval follow-up. Patient has history of kidney stones, IBS, diverticulosis, GERD and internal hemorrhoids. Comes in today stating that he is been having ongoing IBS symptoms over the past year.  His current complaint is a feeling of bloating or distention which is been present for months but worse over the past month or so.  He describes it as a pressure type feeling pushes up on his abdomen.  He is not experiencing this every day and says today he does not feel bloated.  However he is very frequently feeling this way it is not having any nausea or vomiting, no abdominal pain, no changes in bowel habits melena or hematochezia. He has taken a couple of courses of antibiotics but both of these were about 6 months ago, no changes in medications.  He does believe that he is lactose intolerant and tends to avoid lactose though states that he does use cream for his coffee.  He has tried Levsin in the past which is somewhat beneficial and has used Phazyme which is been somewhat helpful as well.  He is not using any artificial sweeteners.  Review of Systems Pertinent positive and negative review of systems were noted in the above HPI section.  All other review of systems was otherwise negative.  Outpatient Encounter Medications as of 09/13/2018  Medication Sig  . Azelaic Acid (FINACEA) 15 % cream Apply topically daily. After skin is thoroughly washed and patted dry, gently but thoroughly massage a thin film of azelaic acid cream into the affected  area twice daily, in the morning and evening.  Marland Kitchen azelastine (OPTIVAR) 0.05 % ophthalmic solution Place 1 drop into both eyes 2 (two) times daily.  Marland Kitchen esomeprazole (NEXIUM) 40 MG capsule Take 40 mg by mouth daily before breakfast.  . hyoscyamine (ANASPAZ) 0.125 MG TBDP Place 0.125 mg under the tongue.  . loratadine (CLARITIN) 10 MG tablet Take 10 mg by mouth.  Marland Kitchen LORazepam (ATIVAN) 1 MG tablet 0.5 mg as needed.  . Magnesium 250 MG TABS Take by mouth as needed.  . methocarbamol (ROBAXIN) 500 MG tablet Take 500 mg by mouth as needed for muscle spasms.  . Probiotic Product (ALIGN) 4 MG CAPS Take by mouth.  . ranitidine (ZANTAC) 150 MG tablet Take 150 mg by mouth daily.  . Simethicone (PHAZYME) 180 MG CAPS Take by mouth as needed.  . rifaximin (XIFAXAN) 550 MG TABS tablet Take 1 tablet by mouth 3 times daily for 14 days.  . [DISCONTINUED] hydrocortisone (ANUSOL-HC) 25 MG suppository Place 1 suppository (25 mg total) rectally as needed.  . [DISCONTINUED] hyoscyamine (LEVSIN/SL) 0.125 MG SL tablet Place 1 tablet under the tongue 3 times a day as needed.  . [DISCONTINUED] methocarbamol (ROBAXIN) 750 MG tablet Take 500 mg by mouth as needed for muscle spasms.  . [DISCONTINUED] oxyCODONE (OXY IR/ROXICODONE) 5 MG immediate release tablet Take 1-2 tablets (5-10 mg total) by mouth every 6 (six) hours as needed for moderate pain or severe pain.   No facility-administered encounter medications on file as of 09/13/2018.  Allergies  Allergen Reactions  . Sulfonamide Derivatives Swelling    Swelling in face  . Rocephin [Ceftriaxone Sodium In Dextrose]     Blood in stool  . Penicillins Rash   Patient Active Problem List   Diagnosis Date Noted  . Irritable bowel syndrome (IBS) 02/27/2013  . Diverticulosis of colon without hemorrhage 02/27/2013  . HEMORRHOIDS, INTERNAL 12/20/2008  . GERD 12/17/2008  . DUODENITIS 12/17/2008  . GI BLEEDING 12/17/2008   Social History   Socioeconomic History  . Marital  status: Married    Spouse name: Not on file  . Number of children: 3  . Years of education: Not on file  . Highest education level: Not on file  Occupational History  . Occupation: Education officer, environmental  Social Needs  . Financial resource strain: Not on file  . Food insecurity:    Worry: Not on file    Inability: Not on file  . Transportation needs:    Medical: Not on file    Non-medical: Not on file  Tobacco Use  . Smoking status: Never Smoker  . Smokeless tobacco: Never Used  Substance and Sexual Activity  . Alcohol use: Yes    Comment: rarely  . Drug use: No  . Sexual activity: Not on file  Lifestyle  . Physical activity:    Days per week: Not on file    Minutes per session: Not on file  . Stress: Not on file  Relationships  . Social connections:    Talks on phone: Not on file    Gets together: Not on file    Attends religious service: Not on file    Active member of club or organization: Not on file    Attends meetings of clubs or organizations: Not on file    Relationship status: Not on file  . Intimate partner violence:    Fear of current or ex partner: Not on file    Emotionally abused: Not on file    Physically abused: Not on file    Forced sexual activity: Not on file  Other Topics Concern  . Not on file  Social History Narrative  . Not on file    Mr. Broadfoot family history includes Colon cancer in his maternal aunt; Heart disease in his father and mother; Prostate cancer in his paternal uncle.      Objective:    Vitals:   09/13/18 0915  BP: 120/66  Pulse: 76    Physical Exam; well-developed older white male in no acute distress, pleasant blood pressure 120/66 pulse 76, BMI 23.7.  HEENT ;nontraumatic normocephalic EOMI PERRLA sclera anicteric oral mucosa moist, Cardiovascular ;regular rate and rhythm with S1-S2 no murmur rub or gallop, Pulmonary ;clear bilaterally, Abdomen ;soft, no visible distention, no tympany bowel sounds are present no palpable mass or  hepatosplenomegaly no focal tenderness.  Rectal; exam not done, Ext; no clubbing cyanosis or edema skin warm and dry, Neuro/ alert and oriented, grossly nonfocal mood and affect appropriate       Assessment & Plan:   #57 67 year old white male with history of IBS with several month history of frequent abdominal bloating and pressure worse over the past month.  Usually alleviated somewhat with passage of flatus.  No alarm symptoms. I suspect he has a component of small intestinal bacterial overgrowth.  2 diverticulosis 3.  Colon cancer surveillance-up-to-date with last colonoscopy December 2013 no polyps due for 10-year interval follow-up 4.  GERD stable on Nexium 40 mg every morning  Plan;  He is encouraged to follow a lactose-free diet suggested he find a lactose-free creamer. Lower gas diet discussed, he was given a copy Avoid carbonated beverages and artificial sweeteners Give him a course of Xifaxan 550 mg p.o. x14 days area He will follow-up with Dr. Marina Goodell or myself on an as-needed basis and knows to call if he has not had any improvement symptoms after the course of Xifaxan. Also discussed use of Gas-X or Phazyme as needed and Levsin 1 p.o. every 6 hours as needed as needed.  Amy S Esterwood PA-C 09/14/2018   Cc: Cheral Bay, MD

## 2018-09-14 NOTE — Progress Notes (Signed)
Assessment and plans noted ?

## 2018-09-14 NOTE — Telephone Encounter (Signed)
Called [facility] and spoke to a representative.  His copay was $ 500.00.  They are calling the patient to advise him.  I told the patient to call me once he speaks to [facility] if the copay is too high.

## 2018-09-15 ENCOUNTER — Telehealth: Payer: Self-pay | Admitting: *Deleted

## 2018-09-15 NOTE — Telephone Encounter (Signed)
The patient called me back and thanked me for the samples and said he will pick them up Monday , 10-7. He is 45 minutes away and cannot make it before we close today.

## 2018-09-15 NOTE — Telephone Encounter (Signed)
LM for the patient on his home number and cell #. I have # 42 tablets for him for samples of the Xifaxan. I can wait until 5;10 PM today. If he cannot get there today, we will have them at the front desk for Monday, 09-18-2018.

## 2019-12-17 ENCOUNTER — Telehealth: Payer: Self-pay | Admitting: Physician Assistant

## 2019-12-17 DIAGNOSIS — K6389 Other specified diseases of intestine: Secondary | ICD-10-CM

## 2019-12-17 NOTE — Telephone Encounter (Signed)
Patient last seen 09/13/2018. Called to make an appointment unsuccessfully.  Complains of 3 days of increased flatulence, discomfort in the low abdominal area, and a sensation of needing to go to move his bowels. He is not constipated however. Bowel movement this morning was normal. Symptoms persisted. Sunday he took loperamide which made him feel better. No recent treatment for diverticulitis. He is not certain it is diverticulitis or flaring of his IBS.  Suggestions? He is asking if he could have Xifaxan.

## 2019-12-18 MED ORDER — RIFAXIMIN 550 MG PO TABS: 550.0000 mg | ORAL_TABLET | Freq: Two times a day (BID) | ORAL | 0 refills | Status: AC

## 2019-12-18 NOTE — Telephone Encounter (Signed)
Pt was treated for SIBO in 2019 - if he feels  sxs are very similar and that the xifaxan helped previosly then Im ok giving him a course of Xifaxan 55 mg po BID x 10 days

## 2019-12-18 NOTE — Telephone Encounter (Signed)
Verbal order received for clarification of Xifaxan: 550 mg po BID x 10 days RX sent to patient's pharmacy -medication is too expensive for patient to afford- 10 days worth of samples from office left at 3rd floor receptionist desk for patient pick up per PA's request;  Called and spoke with patient= patient advised of samples being at receptionist desk-patient reports he will come to office and pick up samples;  Patient advised to call back to the office at [phone] should questions/concerns arise;  Patient verbalized understanding of information/instructions;

## 2019-12-18 NOTE — Telephone Encounter (Signed)
Order clarification: Xifaxan 550 mg po BID x 10 days?

## 2020-01-21 ENCOUNTER — Ambulatory Visit: Payer: Medicare Other

## 2020-02-28 ENCOUNTER — Ambulatory Visit (INDEPENDENT_AMBULATORY_CARE_PROVIDER_SITE_OTHER): Payer: Medicare Other | Admitting: Internal Medicine

## 2020-02-28 ENCOUNTER — Encounter: Payer: Self-pay | Admitting: Internal Medicine

## 2020-02-28 VITALS — BP 122/60 | HR 94 | Temp 97.7°F | Ht 71.0 in | Wt 174.0 lb

## 2020-02-28 DIAGNOSIS — R109 Unspecified abdominal pain: Secondary | ICD-10-CM

## 2020-02-28 DIAGNOSIS — R140 Abdominal distension (gaseous): Secondary | ICD-10-CM | POA: Diagnosis not present

## 2020-02-28 DIAGNOSIS — K589 Irritable bowel syndrome without diarrhea: Secondary | ICD-10-CM | POA: Diagnosis not present

## 2020-02-28 MED ORDER — HYOSCYAMINE SULFATE 0.125 MG PO TBDP: 0.1250 mg | ORAL_TABLET | Freq: Every day | ORAL | 3 refills | Status: AC | PRN

## 2020-02-28 NOTE — Progress Notes (Signed)
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  Patient has been seen by the GI physician assistant on several occasions.  Most recently October 2019.  Was having lower abdominal discomfort that he felt might be diverticulitis at that time.  Symptoms improved with passing flatus.  He was treated with Xifaxan which seemed to help.  Over the years he has used sublingual Levsin for abdominal spasm.  His last complete colonoscopy performed by Dr. Juanda Wood December 2013.  He was found to have mild sigmoid diverticulosis but otherwise normal.  Follow-up in 10 years recommended.  I did see the patient in 2018 when he presented with symptomatic gallbladder disease for which she subsequently underwent laparoscopic cholecystectomy.  That pain has resolved.  He tells me that he had Covid in November.  He recovered.  He is also received his Covid vaccination (first dose).  He does have history of GERD which is controlled nicely with Nexium in the morning and cimetidine at night.  No dysphagia.  His bowel habits are regular.  No bleeding.  He does request a refill of his Levsin.  He does notice that his abdominal fullness or bloating type discomfort is prominent the morning.  Seems to get better after meals.  He has gained 5 pounds since his last visit.  No recent x-ray assessment.  REVIEW OF SYSTEMS:  All non-GI ROS negative unless otherwise stated in the HPI except for sinus and allergy, arthritis, fatigue, heart murmur, sleeping problems  Past Medical History:  Diagnosis Date  . Arthritis    knees and shoulders  . COVID-19   . Diverticulosis of sigmoid colon 11/29/2012  . Duodenitis without mention of hemorrhage   . Gallstones   . GERD (gastroesophageal reflux disease)   . History of hiatal hernia   . Internal hemorrhoids without mention of complication   . Kidney  stone   . MVP (mitral valve prolapse)   . Right bundle branch block   . Seasonal allergies     Past Surgical History:  Procedure Laterality Date  . CHOLECYSTECTOMY N/A 08/22/2017   Procedure: LAPAROSCOPIC CHOLECYSTECTOMY WITH INTRAOPERATIVE CHOLANGIOGRAM;  Surgeon: Raymond Miyamoto, MD;  Location: [facility];  Service: General;  Laterality: N/A;  . DENTAL SURGERY    . EYE SURGERY     blephorplasty    Social History Raymond Wood  reports that he has never smoked. He has never used smokeless tobacco. He reports current alcohol use. He reports that he does not use drugs.  family history includes Colon cancer in his maternal aunt; Heart disease in his father and mother; Prostate cancer in his paternal uncle.  Allergies  Allergen Reactions  . Sulfonamide Derivatives Swelling    Swelling in face  . Rocephin [Ceftriaxone Sodium In Dextrose]     Blood in stool  . Penicillins Rash       PHYSICAL EXAMINATION: Vital signs: BP 122/60   Pulse 94   Temp 97.7 F (36.5 C)   Ht 5\' 11"  (1.803 m)   Wt 174 lb (78.9 kg)   BMI 24.27 kg/m   Constitutional: generally well-appearing, no acute distress Psychiatric: alert and oriented x3, cooperative Eyes: extraocular movements intact, anicteric, conjunctiva pink Mouth: oral pharynx moist, no lesions Neck: supple no lymphadenopathy Cardiovascular: heart regular rate and rhythm, 2/6 systolic murmur murmur Lungs: clear to auscultation bilaterally Abdomen: soft, nontender,  nondistended, no obvious ascites, no peritoneal signs, normal bowel sounds, no organomegaly Rectal: Omitted Extremities: no clubbing, cyanosis, or lower extremity edema bilaterally Skin: no lesions on visible extremities Neuro: No focal deficits.  Cranial nerves intact  ASSESSMENT:  1.  GERD.  Managed with PPI and at night H2 receptor antagonist therapy 2.  Abdominal fullness and bloating discomfort.  Likely related to his IBS 3.  Status post  cholecystectomy 4.  Negative screening colonoscopy save mild diverticulosis 2013 5.  General medical problems.  Stable   PLAN:  1.  Reflux precautions 2.  Continue GERD regimen 3.  Refill Levsin.  Medication risks reviewed 4.  Schedule abdominal ultrasound to evaluate abdominal fullness.  He will be contacted after the results are available.  Additional recommendations provided if necessary. 5.  Will be due for routine screening colonoscopy 2023.  He is aware 6.  Contact the office in the interim for any questions or problems, assuming ultrasound does not dictate otherwise.

## 2020-02-28 NOTE — Patient Instructions (Addendum)
We have sent the following medications to your pharmacy for you to pick up at your convenience:  Hyoscyamine  You have been scheduled for an abdominal ultrasound at [facility] (1st floor of hospital) on 3/23/2021at 8:30am. Please arrive 15 minutes prior to your appointment for registration. Make certain not to have anything to eat or drink after midnight prior to your appointment. Should you need to reschedule your appointment, please contact radiology at [phone]. This test typically takes about 30 minutes to perform.

## 2020-03-04 ENCOUNTER — Other Ambulatory Visit: Payer: Self-pay

## 2020-03-04 ENCOUNTER — Ambulatory Visit (HOSPITAL_COMMUNITY)
Admission: RE | Admit: 2020-03-04 | Discharge: 2020-03-04 | Disposition: A | Discharge status: Home / Self Care | Payer: Medicare Other | Source: Ambulatory Visit | Attending: Internal Medicine | Admitting: Internal Medicine

## 2020-03-04 DIAGNOSIS — R109 Unspecified abdominal pain: Secondary | ICD-10-CM | POA: Insufficient documentation

## 2020-03-04 DIAGNOSIS — K589 Irritable bowel syndrome without diarrhea: Secondary | ICD-10-CM | POA: Diagnosis not present

## 2021-06-29 ENCOUNTER — Ambulatory Visit: Payer: Medicare Other | Admitting: Physician Assistant

## 2022-01-31 IMAGING — US US ABDOMEN COMPLETE
1 series · 14 of 25 positions shown · non-contrast
Comparison: None.

CLINICAL DATA: Abdominal pain, cholecystectomy, irritable bowel
syndrome, bloating

EXAM:
ABDOMEN ULTRASOUND COMPLETE

[Series 1: us abdomen complete · 14 of 78 slices shown]
[im 1/78]
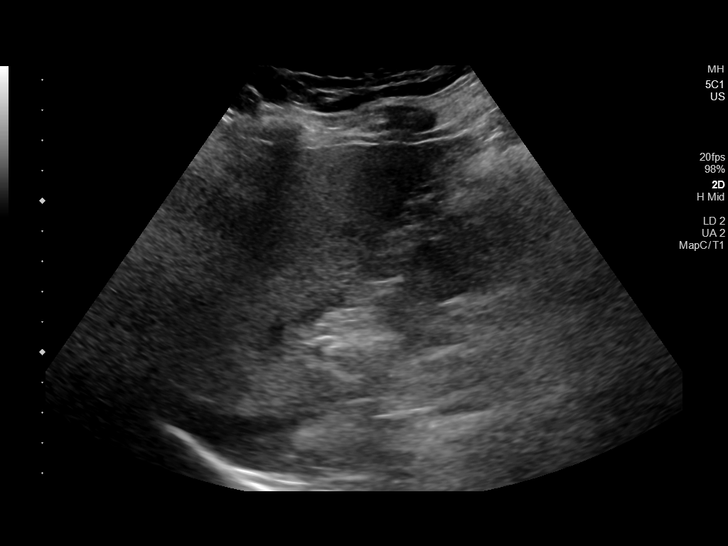
[im 7/78]
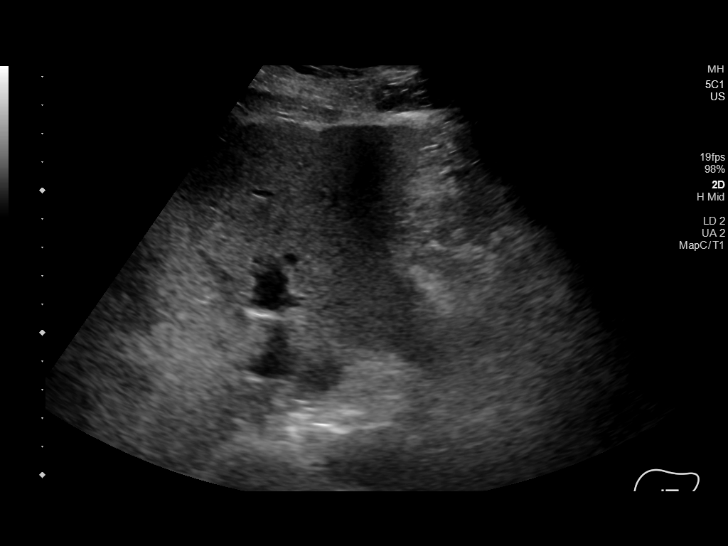
[im 13/78]
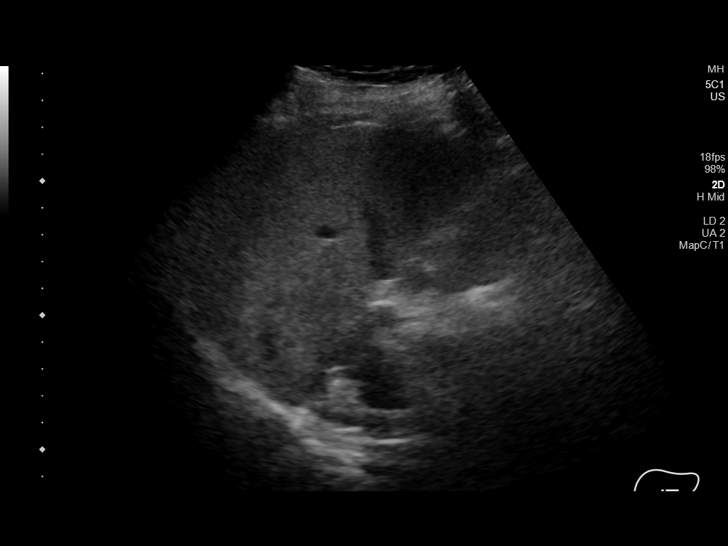
[im 20/78]
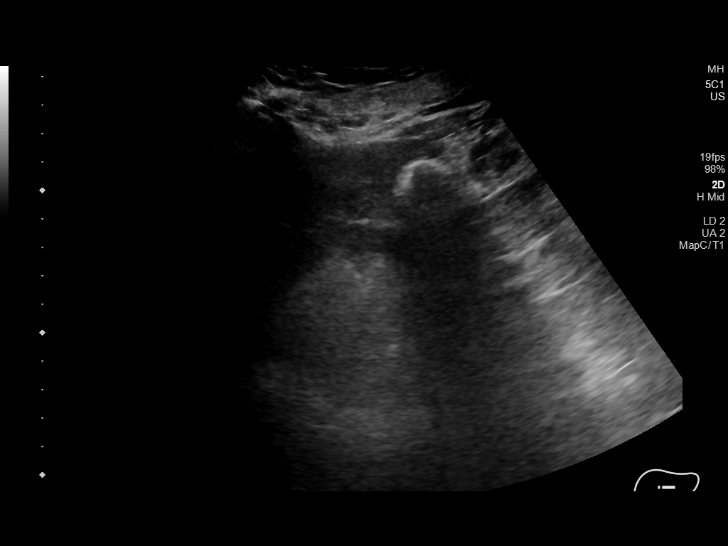
[im 26/78]
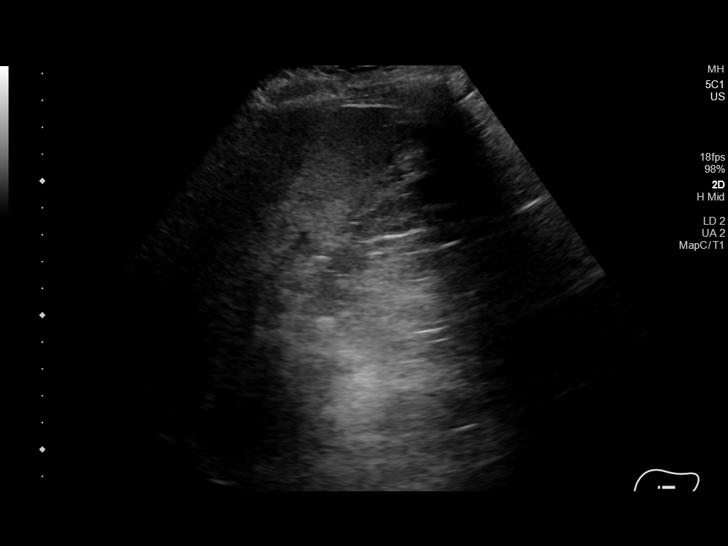
[im 29/78]
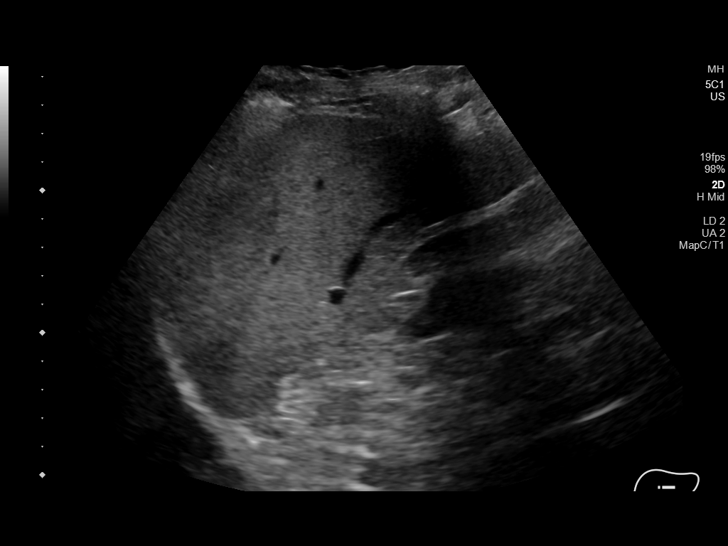
[im 36/78]
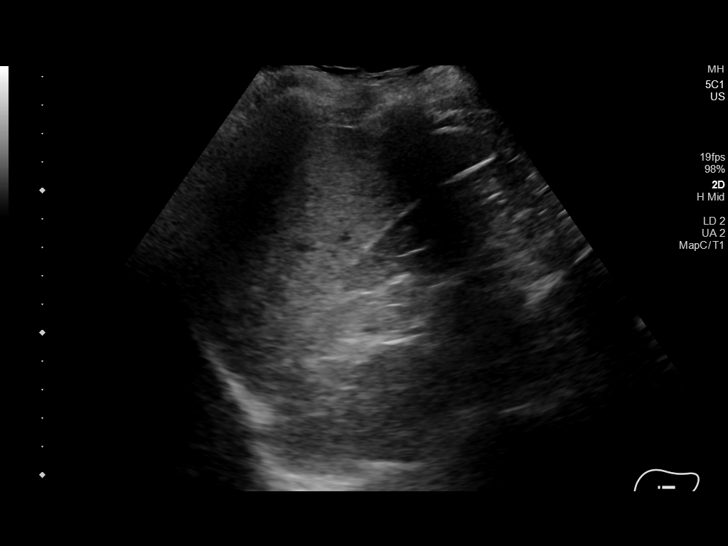
[im 42/78]
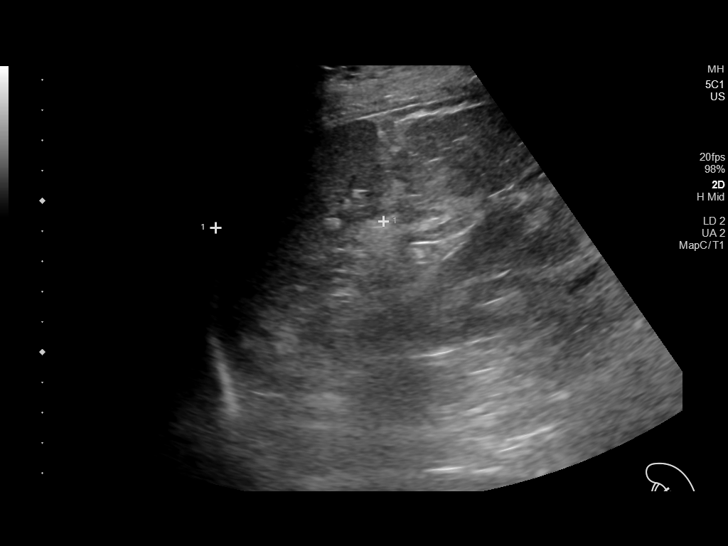
[im 49/78]
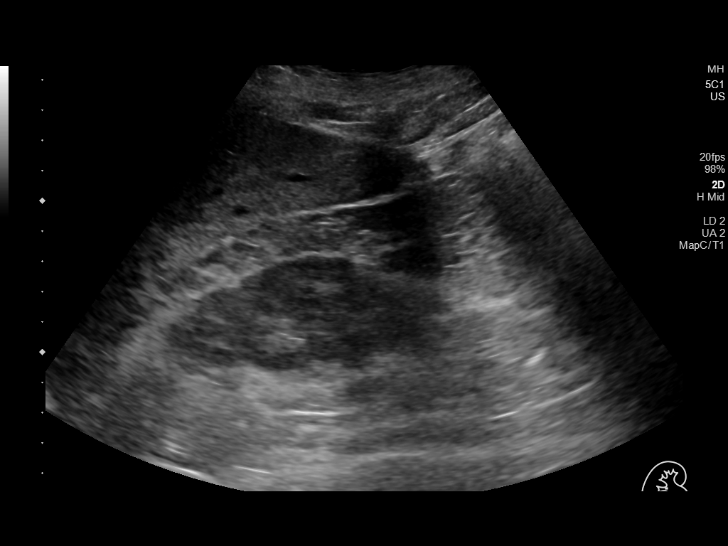
[im 52/78]
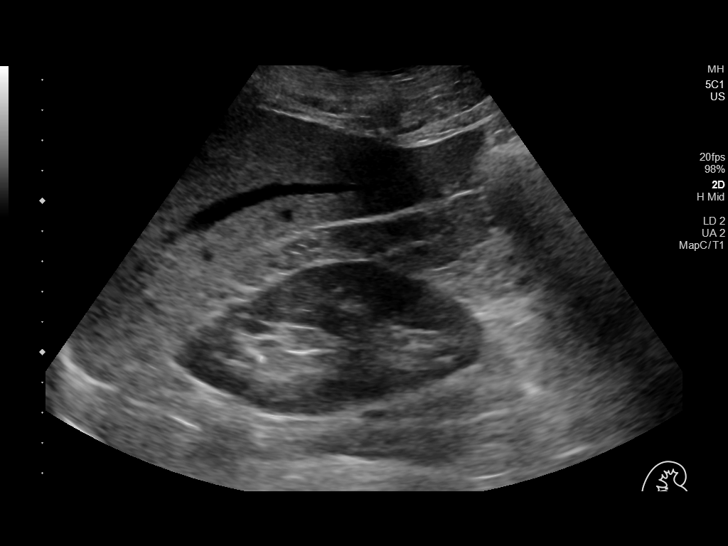
[im 58/78]
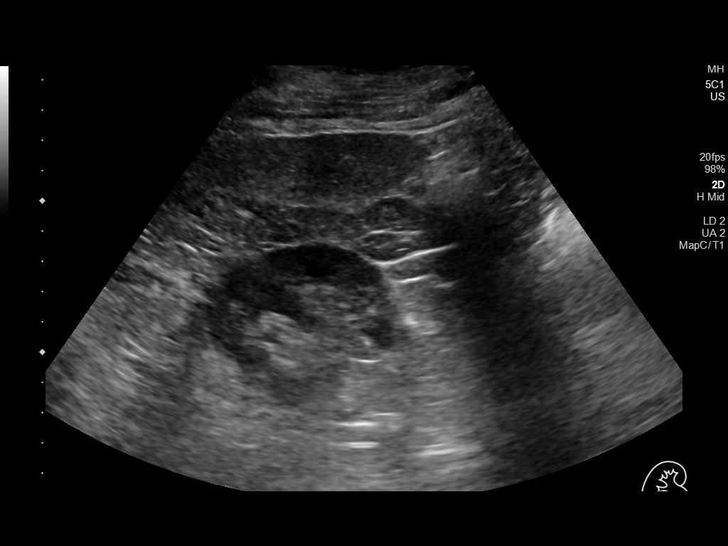
[im 65/78]
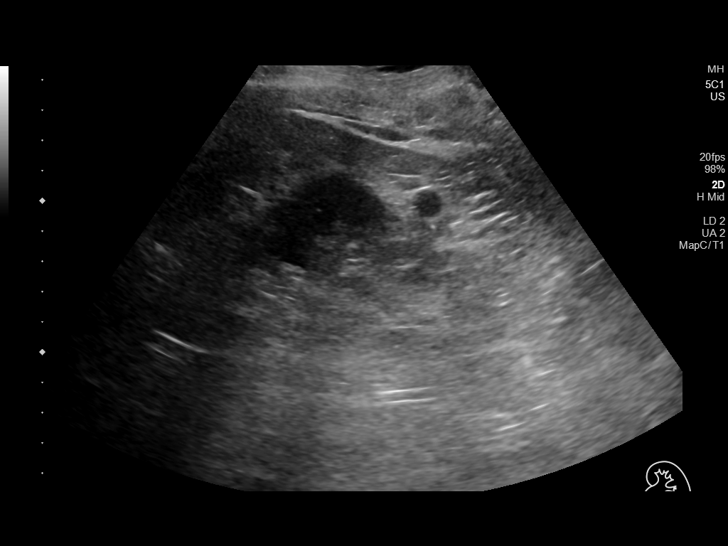
[im 71/78]
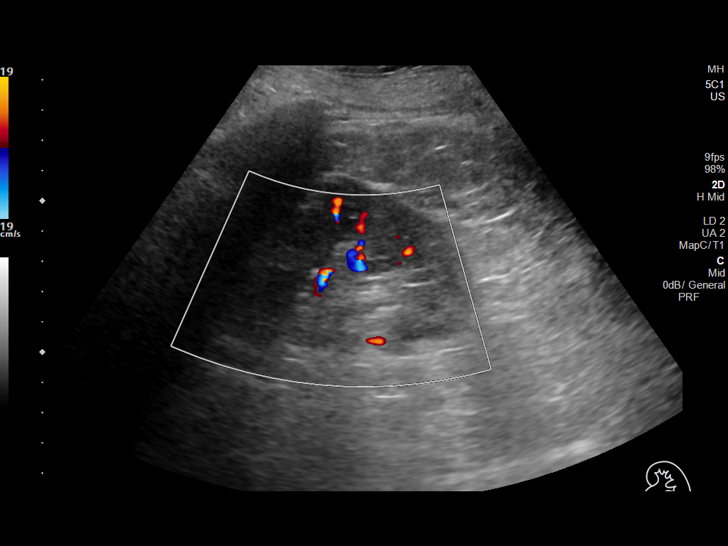
[im 78/78]
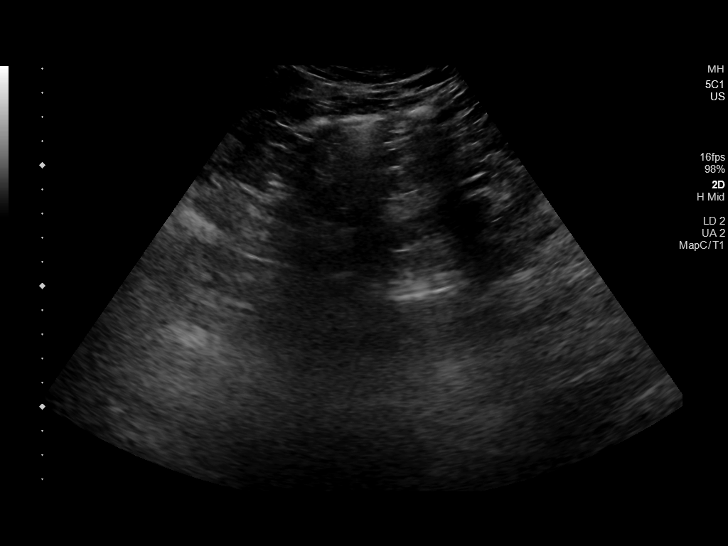

[14 of 25 positions shown; findings below may reference images not displayed]

FINDINGS: Gallbladder: Status post cholecystectomy

Common bile duct: Diameter: 4 mm

Liver: Heterogeneous increased echotexture consistent with
fibrofatty infiltration. No focal liver abnormalities. Portal vein
is patent on color Doppler imaging with normal direction of blood
flow towards the liver.

IVC: No abnormality visualized.

Pancreas: Limited evaluation due to bowel gas.

Spleen: Size and appearance within normal limits.

Right Kidney: Length: 10.3. Echogenicity within normal limits. No
mass or hydronephrosis visualized.

Left Kidney: Length: 10.9. Echogenicity within normal limits.
Exophytic 1 cm cyst off the lower pole.

Abdominal aorta: No aneurysm visualized. Evaluation limited by bowel
gas.

Other findings: None.
IMPRESSION: 1. Status post cholecystectomy.
2. Fibrofatty infiltration of the liver.
3. 1 cm cyst left kidney.

## 2022-08-12 ENCOUNTER — Encounter: Payer: Self-pay | Admitting: Nurse Practitioner

## 2022-08-12 ENCOUNTER — Ambulatory Visit (INDEPENDENT_AMBULATORY_CARE_PROVIDER_SITE_OTHER): Payer: Medicare Other | Admitting: Nurse Practitioner

## 2022-08-12 VITALS — BP 120/60 | HR 75 | Ht 71.0 in | Wt 164.0 lb

## 2022-08-12 DIAGNOSIS — K589 Irritable bowel syndrome without diarrhea: Secondary | ICD-10-CM

## 2022-08-12 DIAGNOSIS — Z1211 Encounter for screening for malignant neoplasm of colon: Secondary | ICD-10-CM

## 2022-08-12 MED ORDER — NA SULFATE-K SULFATE-MG SULF 17.5-3.13-1.6 GM/177ML PO SOLN: 1.0000 | Freq: Once | ORAL | 0 refills | Status: AC

## 2022-08-12 NOTE — Patient Instructions (Addendum)
_______________________________________________________  If you are age 71 or older, your body mass index should be between 23-30. Your Body mass index is 22.87 kg/m. If this is out of the aforementioned range listed, please consider follow up with your Primary Care Provider.  If you are age 86 or younger, your body mass index should be between 19-25. Your Body mass index is 22.87 kg/m. If this is out of the aformentioned range listed, please consider follow up with your Primary Care Provider.   ________________________________________________________  The [facility] providers would like to encourage you to use [facility] to communicate with providers for non-urgent requests or questions.  Due to long hold times on the telephone, sending your provider a message by [facility] may be a faster and more efficient way to get a response.  Please allow 48 business hours for a response.  Please remember that this is for non-urgent requests.  _______________________________________________________  We have given you samples of the following medication to take: IBGard take 2 times a day as needed for abdominal pain  It has been recommended to you by your physician that you have a(n) Colonoscopy completed in December with Dr Marina Goodell. Please contact our office at [phone] late September/early October to see about scheduling a December procedure. You have been given blank instructions. Suprep has been sent in to the pharmacy  Follow up with PCP to repeat CBC secondary to low platelet count  It was a pleasure to see you today!  Thank you for trusting me with your gastrointestinal care!

## 2022-08-12 NOTE — Progress Notes (Signed)
Noted  

## 2022-08-12 NOTE — Progress Notes (Signed)
08/12/2022 DELOIS TOLBERT [id number] Jun 25, 1951   Chief Complaint: Loose stools   History of Present Illness: Jeffrie Lofstrom. Show is a 71 year old  male with a past medical history of arthritis, kidney stones, MVP, MR, NSVT, enlarged ascending aorta, B12 deficiency, hepatic steatosis, GERD, IBS, diverticulosis and one hyperplastic colon polyp 2001. S/P laparoscopic cholecystectomy 2018. He is followed by Dr. Henrene Pastor. He developed a flare of IBS symptoms 6 weeks ago. He described passing several nonbloody loose stools with mild abdominal cramping. He also noticed passing more flatulence. He contacted his PCP who increased Hyoscyamine to tid until he was seen in our office as scheduled today.  He was previously prescribed a course of Xifaxan for past IBS flares but he does not think this treatment is necessary at this point as he is no longer having loose stools.  He took hyoscyamine 3 times daily for few days and his symptoms improved but he developed dry mouth so he stopped taking it on a consistent basis.  He is passing a formed stool once or twice daily for the past 1 to 2 weeks.  No rectal bleeding or black stools.  No abdominal pain.  An empty stomach possibly triggers his IBS symptoms.  He avoid skipping meals.  No recent antibiotics.  He intentionally lost 6 to 8 pounds over the past 6 months after his PCP advised a low-fat diet to reduce his cholesterol level.  He stated he was diagnosed with vitamin B12 deficiency, prescribed B12 injections once monthly.  His most recent colonoscopy 11/29/2012 showed mild sigmoid diverticulosis, no polyps.  Normal colonoscopy in 2006.  1 hyperplastic polyp was removed from the colon per colonoscopy in 2001. Maternal aunt with history of colon cancer.  GERD symptoms are well controlled on Esomeprazole 40 mg daily and Cimetidine 200 mg nightly.  Labs 05/24/2022: WBC 4.3.  Hemoglobin 13.8.  Hematocrit 40.9.  MCV 99.  Platelet 138.  B12 150.  Folate 17.7.  Sodium 139.   Potassium 3.9.  BUN 16.  Creatinine 1.14.  Glucose 86.  Albumin 4.2.  Total bili 0.7.  Alk phos 66.  AST 24.  ALT 19.  Labs 07/08/2022: B12 level 1,021.    Labs 07/20/2022: Pancreatic elastase level 256.  H. pylori stool antigen negative.    IMAGE STUDIES:  RUQ sono 03/04/2020: Gallbladder: Status post cholecystectomy   Common bile duct: Diameter: 4 mm   Liver: Heterogeneous increased echotexture consistent with fibrofatty infiltration. No focal liver abnormalities. Portal vein is patent on color Doppler imaging with normal direction of blood flow towards the liver.   IVC: No abnormality visualized.   Pancreas: Limited evaluation due to bowel gas.   Spleen: Size and appearance within normal limits.   Right Kidney: Length: 10.3. Echogenicity within normal limits. No mass or hydronephrosis visualized.   Left Kidney: Length: 10.9. Echogenicity within normal limits. Exophytic 1 cm cyst off the lower pole.   Abdominal aorta: No aneurysm visualized. Evaluation limited by bowel gas.   Other findings: None.   IMPRESSION: 1. Status post cholecystectomy. 2. Fibrofatty infiltration of the liver. 3. 1 cm cyst left kidney.  GI PROCEDURES:    Colonoscopy 11/29/2012 by Dr. Olevia Perches: Mild sigmoid diverticulosis  10 year colonoscopy recall  Colonoscopy 11/02/2005: No polyps   Colonoscopy 09/15/2000: 72m hyperplastic polyp removed from the left colon   EGD 09/15/2000: Grade 1 esophagitis  Mild chronic duodenitis, no villous atrophy  ECHO/TEE 06/22/2021:   LEFT VENTRICLE  The left ventricular size  is normal. The left ventricle is mildly  dilated. Left ventricular systolic function is normal. LV ejection  fraction = 55-60%. Left ventricular filling pattern is indeterminate.  Mitral inflow deceleration timeNormal>150 msec. The left ventricular  wall motion is normal.   RIGHT VENTRICLE  The right ventricle is normal size. The right ventricular systolic  function is normal.    LEFT ATRIUM  The left atrium is mildly dilated. LA vol index: 39.5 ml/m2. The left  atrial volume is mildly increased.   RIGHT ATRIUM  Right atrial size is normal. There is no Doppler evidence for a patent  foramen ovale.   AORTIC VALVE  The aortic valve is trileaflet. The aortic valve opens well. There is  no aortic stenosis. There is mild aortic regurgitation.   MITRAL VALVE  There is moderate mitral regurgitation. There is no mitral stenosis.  The regurgitant jet is posteriorly-directed. Cannot exclude anterior  leaflet mitral valve prolapse.  -  TRICUSPID VALVE  Structurally normal tricuspid valve. There is trace tricuspid  regurgitation. There is no tricuspid stenosis. There was insufficient  TR detected to calculate RV systolic pressure. Estimated right atrial  pressure is 5 mmHg..  -  PULMONIC VALVE  The pulmonic valve is not well visualized. There is no pulmonic  valvular regurgitation. There is no pulmonic valvular stenosis.   ARTERIES  The aortic sinus is normal size. The ascending aorta is mildly dilated  at 4.0 cm.   VENOUS  Pulmonary venous flow pattern is normal. The IVC is normal in size  with an inspiratory collapse of greater then 50%, suggesting normal  right atrial pressure.   EFFUSION  There is no pericardial effusion. There is no pleural effusion.   Past Medical History:  Diagnosis Date   Arthritis    knees and shoulders   COVID-19    Diverticulosis of sigmoid colon 11/29/2012   Duodenitis without mention of hemorrhage    Gallstones    GERD (gastroesophageal reflux disease)    History of hiatal hernia    Internal hemorrhoids without mention of complication    Kidney stone    MVP (mitral valve prolapse)    Right bundle branch block    Seasonal allergies    Past Surgical History:  Procedure Laterality Date   CHOLECYSTECTOMY N/A 08/22/2017   Procedure: LAPAROSCOPIC CHOLECYSTECTOMY WITH INTRAOPERATIVE CHOLANGIOGRAM;  Surgeon: Coralie Keens, MD;  Location: [facility];  Service: General;  Laterality: N/A;   DENTAL SURGERY     EYE SURGERY     blephorplasty   Current Outpatient Medications on File Prior to Visit  Medication Sig Dispense Refill   Azelaic Acid (FINACEA) 15 % cream Apply topically daily. After skin is thoroughly washed and patted dry, gently but thoroughly massage a thin film of azelaic acid cream into the affected area twice daily, in the morning and evening.     cetirizine (ZYRTEC) 5 MG tablet Take 5 mg by mouth daily.     Cholecalciferol (VITAMIN D3 SUPER STRENGTH) 50 MCG (2000 UT) CAPS Take 2,000 Units by mouth daily.     cimetidine (TAGAMET) 200 MG tablet Take 200 mg by mouth at bedtime.     cyanocobalamin (VITAMIN B12) 1000 MCG tablet Take 1,000 mcg by mouth daily.     esomeprazole (NEXIUM) 40 MG capsule Take 40 mg by mouth daily before breakfast.     hyoscyamine (ANASPAZ) 0.125 MG TBDP disintergrating tablet Place 1 tablet (0.125 mg total) under the tongue daily as needed. 60 tablet 3  levocetirizine (XYZAL) 5 MG tablet Take 5 mg by mouth daily in the afternoon.     Melatonin 10 MG TBCR Take 1 tablet by mouth daily in the afternoon.     methocarbamol (ROBAXIN) 500 MG tablet Take 500 mg by mouth as needed for muscle spasms.     olopatadine (PATADAY) 0.1 % ophthalmic solution 1 drop 2 (two) times daily.     simethicone (MYLICON) 161 MG chewable tablet Chew 125 mg by mouth every 6 (six) hours as needed for flatulence.     No current facility-administered medications on file prior to visit.   Allergies  Allergen Reactions   Sulfonamide Derivatives Swelling    Swelling in face   Rocephin [Ceftriaxone Sodium In Dextrose]     Blood in stool   Statins    Sulfa Antibiotics    Penicillins Rash   Current Medications, Allergies, Past Medical History, Past Surgical History, Family History and Social History were reviewed in [employer] record.  Review of Systems:    Constitutional: + Intentional weight loss.  Respiratory: Negative for shortness of breath.   Cardiovascular: Negative for chest pain, palpitations and leg swelling.  Gastrointestinal: See HPI.  Musculoskeletal: Negative for back pain or muscle aches.  Neurological: Negative for dizziness, headaches or paresthesias.   Physical Exam: BP 120/60   Pulse 75   Ht '5\' 11"'  (1.803 m)   Wt 164 lb (74.4 kg)   BMI 22.87 kg/m   Wt Readings from Last 3 Encounters:  08/12/22 164 lb (74.4 kg)  02/28/20 174 lb (78.9 kg)  09/13/18 169 lb 14.4 oz (77.1 kg)    General: 71 year old male in no acute distress. Head: Normocephalic and atraumatic. Eyes: No scleral icterus. Conjunctiva pink . Ears: Normal auditory acuity. Mouth: Dentition intact. No ulcers or lesions.  Lungs: Clear throughout to auscultation. Heart: Regular rate and rhythm. Systolic murmur.  Abdomen: Soft, nontender and nondistended. No masses or hepatomegaly. Normal bowel sounds x 4 quadrants.  Rectal: Deferred. Musculoskeletal: Symmetrical with no gross deformities. Extremities: No edema. Neurological: Alert oriented x 4. No focal deficits.  Psychological: Alert and cooperative. Normal mood and affect  Assessment and Recommendations:  45) 71 year old male with IBS, recent flare with loose stools and mild abdominal cramping, resolved -Patient to contact office if loose stools recur -Consider course of Xifaxan if loose stools recur  -Hyoscyamine 0.125 mg 1 tab sublingual every 8 hours as needed, patient counseled to stop taking hyoscyamine if he develops dizziness or lightheadedness after taking it  2) GERD, stable  -Continue Esomeprazole 40 mg daily and Cimetidine 200 mg nightly -GERD diet  3) History of a hyperplastic colon polyp 11/2012. Maternal aunt with history of colon cancer.  -Colonoscopy benefits and risks discussed including risk with sedation, risk of bleeding, perforation and infection  -Schedule colonoscopy  11/2022 -Patient will contact her office if he has any change in his overall health status prior to his colonoscopy date  4) Hepatic steatosis. Normal LFTs.  He has intentionally lost 68 pounds over the past 6 months. -Avoid weight gain. -See plan in # 5  5) Mild thrombocytopenia  -Patient elects to have a repeat CBC to recheck platelet count with his PCP at the time of his annual physical 11/2022 -I recommend scheduling an abdominal ultrasound to evaluate the liver, rule out evidence of cirrhosis if thrombocytopenia persists

## 2022-10-08 ENCOUNTER — Encounter: Payer: Self-pay | Admitting: Internal Medicine

## 2022-10-12 ENCOUNTER — Encounter: Payer: Self-pay | Admitting: Internal Medicine

## 2022-11-11 ENCOUNTER — Ambulatory Visit (AMBULATORY_SURGERY_CENTER): Payer: Self-pay

## 2022-11-11 VITALS — Ht 71.0 in | Wt 162.0 lb

## 2022-11-11 DIAGNOSIS — Z1211 Encounter for screening for malignant neoplasm of colon: Secondary | ICD-10-CM

## 2022-11-11 NOTE — Progress Notes (Signed)
No egg or soy allergy known to patient;  No issues known to pt with past sedation with any surgeries or procedures; Patient denies ever being told they had issues or difficulty with intubation;  No FH of Malignant Hyperthermia; Pt is not on diet pills; Pt is not on home 02; Pt is not on blood thinners;  Pt denies issues with constipation  No A fib or A flutter; Have any cardiac testing pending--NO  Insurance verified during PV appt=Medicare A/B and BCBS Supplement  Patient's chart reviewed by Cathlyn Parsons CNRA prior to previsit and patient appropriate for the [facility].  Previsit completed and red dot placed by patient's name on their procedure day (on provider's schedule).    Patient requested that instructions be sent via MyChart.  Patient reports he has already picked up Suprep RX from pharmacy as the prep was sent in during his OV from 07/2022; Suprep RX not sent in during PV appt;

## 2022-12-09 ENCOUNTER — Ambulatory Visit (AMBULATORY_SURGERY_CENTER): Payer: Medicare Other | Admitting: Internal Medicine

## 2022-12-09 ENCOUNTER — Encounter: Payer: Self-pay | Admitting: Internal Medicine

## 2022-12-09 VITALS — BP 106/71 | HR 68 | Temp 97.1°F | Resp 14 | Ht 71.0 in | Wt 162.0 lb

## 2022-12-09 DIAGNOSIS — Z1211 Encounter for screening for malignant neoplasm of colon: Secondary | ICD-10-CM

## 2022-12-09 MED ORDER — SODIUM CHLORIDE 0.9 % IV SOLN: 500.0000 mL | Freq: Once | INTRAVENOUS | Status: DC

## 2022-12-09 NOTE — Progress Notes (Signed)
Pt's states no medical or surgical changes since previsit or office visit. VS assessed by C.W 

## 2022-12-09 NOTE — Progress Notes (Signed)
Report to PACU, RN, vss, BBS= Clear.  

## 2022-12-09 NOTE — Op Note (Signed)
[place] Patient Name: Raymond Wood Procedure Date: 12/09/2022 7:12 AM MRN: [id number] Endoscopist: Docia Chuck. Henrene Pastor , MD, [id number] Age: 71 Referring MD:  Date of Birth: 1951/03/25 Gender: Male Account #: [id number] Procedure:                Colonoscopy Indications:              Screening for colorectal malignant neoplasm.                            Previous examinations 2001, 2006, 2013 were                            negative for neoplasia Medicines:                Monitored Anesthesia Care Procedure:                Pre-Anesthesia Assessment:                           - Prior to the procedure, a History and Physical                            was performed, and patient medications and                            allergies were reviewed. The patient's tolerance of                            previous anesthesia was also reviewed. The risks                            and benefits of the procedure and the sedation                            options and risks were discussed with the patient.                            All questions were answered, and informed consent                            was obtained. Prior Anticoagulants: The patient has                            taken no anticoagulant or antiplatelet agents. ASA                            Grade Assessment: II - A patient with mild systemic                            disease. After reviewing the risks and benefits,                            the patient was deemed in satisfactory condition to  undergo the procedure.                           After obtaining informed consent, the colonoscope                            was passed under direct vision. Throughout the                            procedure, the patient's blood pressure, pulse, and                            oxygen saturations were monitored continuously. The                            CF [id number] #[id number] was introduced through the anus                             and advanced to the the cecum, identified by                            appendiceal orifice and ileocecal valve. The                            ileocecal valve, appendiceal orifice, and rectum                            were photographed. The quality of the bowel                            preparation was excellent. The colonoscopy was                            performed without difficulty. The patient tolerated                            the procedure well. The bowel preparation used was                            SUPREP via split dose instruction. Scope In: 8:25:39 AM Scope Out: 8:34:02 AM Scope Withdrawal Time: 0 hours 6 minutes 49 seconds  Total Procedure Duration: 0 hours 8 minutes 23 seconds  Findings:                 A few diverticula were found in the sigmoid colon                            and ascending colon.                           The exam was otherwise without abnormality on                            direct and retroflexion views. Complications:  No immediate complications. Estimated blood loss:                            None. Estimated Blood Loss:     Estimated blood loss: none. Impression:               - Diverticulosis in the sigmoid colon and in the                            ascending colon.                           - The examination was otherwise normal on direct                            and retroflexion views.                           - No specimens collected. Recommendation:           - Repeat colonoscopy is not recommended for                            screening purposes.                           - Patient has a contact number available for                            emergencies. The signs and symptoms of potential                            delayed complications were discussed with the                            patient. Return to normal activities tomorrow.                            Written discharge instructions  were provided to the                            patient.                           - Resume previous diet.                           - Continue present medications. Docia Chuck. Henrene Pastor, MD 12/09/2022 8:40:21 AM This report has been signed electronically.

## 2022-12-09 NOTE — Patient Instructions (Signed)
Handout given on diverticulosis to patient. Resume previous diet and continue present medications. Repeat colonoscopy for surveillance not needed unless new symptoms arise.   YOU HAD AN ENDOSCOPIC PROCEDURE TODAY AT THE [facility]:   Refer to the procedure report that was given to you for any specific questions about what was found during the examination.  If the procedure report does not answer your questions, please call your gastroenterologist to clarify.  If you requested that your care partner not be given the details of your procedure findings, then the procedure report has been included in a sealed envelope for you to review at your convenience later.  YOU SHOULD EXPECT: Some feelings of bloating in the abdomen. Passage of more gas than usual.  Walking can help get rid of the air that was put into your GI tract during the procedure and reduce the bloating. If you had a lower endoscopy (such as a colonoscopy or flexible sigmoidoscopy) you may notice spotting of blood in your stool or on the toilet paper. If you underwent a bowel prep for your procedure, you may not have a normal bowel movement for a few days.  Please Note:  You might notice some irritation and congestion in your nose or some drainage.  This is from the oxygen used during your procedure.  There is no need for concern and it should clear up in a day or so.  SYMPTOMS TO REPORT IMMEDIATELY:  Following lower endoscopy (colonoscopy or flexible sigmoidoscopy):  Excessive amounts of blood in the stool  Significant tenderness or worsening of abdominal pains  Swelling of the abdomen that is new, acute  Fever of 100F or higher  For urgent or emergent issues, a gastroenterologist can be reached at any hour by calling [phone]. Do not use MyChart messaging for urgent concerns.    DIET:  We do recommend a small meal at first, but then you may proceed to your regular diet.  Drink plenty of fluids but you should  avoid alcoholic beverages for 24 hours.  ACTIVITY:  You should plan to take it easy for the rest of today and you should NOT DRIVE or use heavy machinery until tomorrow (because of the sedation medicines used during the test).    FOLLOW UP: Our staff will call the number listed on your records the next business day following your procedure.  We will call around 7:15- 8:00 am to check on you and address any questions or concerns that you may have regarding the information given to you following your procedure. If we do not reach you, we will leave a message.     If any biopsies were taken you will be contacted by phone or by letter within the next 1-3 weeks.  Please call us at [phone] if you have not heard about the biopsies in 3 weeks.    SIGNATURES/CONFIDENTIALITY: You and/or your care partner have signed paperwork which will be entered into your electronic medical record.  These signatures attest to the fact that that the information above on your After Visit Summary has been reviewed and is understood.  Full responsibility of the confidentiality of this discharge information lies with you and/or your care-partner.

## 2022-12-09 NOTE — Progress Notes (Signed)
08/12/2022 Raymond HYER [id number] [birth year]     Chief Complaint: Loose stools    History of Present Illness: Raymond Wood. [name] is a 71 year old  male with a past medical history of arthritis, kidney stones, MVP, MR, NSVT, enlarged ascending aorta, B12 deficiency, hepatic steatosis, GERD, IBS, diverticulosis and one hyperplastic colon polyp 2001. S/P laparoscopic cholecystectomy 2018. He is followed by Dr. Henrene Pastor. He developed a flare of IBS symptoms 6 weeks ago. He described passing several nonbloody loose stools with mild abdominal cramping. He also noticed passing more flatulence. He contacted his PCP who increased Hyoscyamine to tid until he was seen in our office as scheduled today.  He was previously prescribed a course of Xifaxan for past IBS flares but he does not think this treatment is necessary at this point as he is no longer having loose stools.  He took hyoscyamine 3 times daily for few days and his symptoms improved but he developed dry mouth so he stopped taking it on a consistent basis.  He is passing a formed stool once or twice daily for the past 1 to 2 weeks.  No rectal bleeding or black stools.  No abdominal pain.  An empty stomach possibly triggers his IBS symptoms.  He avoid skipping meals.  No recent antibiotics.  He intentionally lost 6 to 8 pounds over the past 6 months after his PCP advised a low-fat diet to reduce his cholesterol level.  He stated he was diagnosed with vitamin B12 deficiency, prescribed B12 injections once monthly.  His most recent colonoscopy 11/29/2012 showed mild sigmoid diverticulosis, no polyps.  Normal colonoscopy in 2006.  1 hyperplastic polyp was removed from the colon per colonoscopy in 2001. Maternal aunt with history of colon cancer.  GERD symptoms are well controlled on Esomeprazole 40 mg daily and Cimetidine 200 mg nightly.   Labs 05/24/2022: WBC 4.3.  Hemoglobin 13.8.  Hematocrit 40.9.  MCV 99.  Platelet 138.  B12 150.  Folate 17.7.  Sodium 139.  Potassium  3.9.  BUN 16.  Creatinine 1.14.  Glucose 86.  Albumin 4.2.  Total bili 0.7.  Alk phos 66.  AST 24.  ALT 19.   Labs 07/08/2022: B12 level 1,021.     Labs 07/20/2022: Pancreatic elastase level 256.  H. pylori stool antigen negative.     IMAGE STUDIES:   RUQ sono 03/04/2020: Gallbladder: Status post cholecystectomy   Common bile duct: Diameter: 4 mm   Liver: Heterogeneous increased echotexture consistent with fibrofatty infiltration. No focal liver abnormalities. Portal vein is patent on color Doppler imaging with normal direction of blood flow towards the liver.   IVC: No abnormality visualized.   Pancreas: Limited evaluation due to bowel gas.   Spleen: Size and appearance within normal limits.   Right Kidney: Length: 10.3. Echogenicity within normal limits. No mass or hydronephrosis visualized.   Left Kidney: Length: 10.9. Echogenicity within normal limits. Exophytic 1 cm cyst off the lower pole.   Abdominal aorta: No aneurysm visualized. Evaluation limited by bowel gas.   Other findings: None.   IMPRESSION: 1. Status post cholecystectomy. 2. Fibrofatty infiltration of the liver. 3. 1 cm cyst left kidney.   GI PROCEDURES:     Colonoscopy 11/29/2012 by Dr. Olevia Perches: Mild sigmoid diverticulosis  10 year colonoscopy recall   Colonoscopy 11/02/2005: No polyps    Colonoscopy 09/15/2000: 26m hyperplastic polyp removed from the left colon    EGD 09/15/2000: Grade 1 esophagitis  Mild chronic duodenitis, no villous atrophy  ECHO/TEE 06/22/2021:   LEFT VENTRICLE  The left ventricular size is normal. The left ventricle is mildly  dilated. Left ventricular systolic function is normal. LV ejection  fraction = 55-60%. Left ventricular filling pattern is indeterminate.  Mitral inflow deceleration timeNormal>150 msec. The left ventricular  wall motion is normal.   RIGHT VENTRICLE  The right ventricle is normal size. The right ventricular systolic  function is normal.    LEFT ATRIUM  The left atrium is mildly dilated. LA vol index: 39.5 ml/m2. The left  atrial volume is mildly increased.   RIGHT ATRIUM  Right atrial size is normal. There is no Doppler evidence for a patent  foramen ovale.    AORTIC VALVE  The aortic valve is trileaflet. The aortic valve opens well. There is  no aortic stenosis. There is mild aortic regurgitation.   MITRAL VALVE  There is moderate mitral regurgitation. There is no mitral stenosis.  The regurgitant jet is posteriorly-directed. Cannot exclude anterior  leaflet mitral valve prolapse.  -  TRICUSPID VALVE  Structurally normal tricuspid valve. There is trace tricuspid  regurgitation. There is no tricuspid stenosis. There was insufficient  TR detected to calculate RV systolic pressure. Estimated right atrial  pressure is 5 mmHg..  -  PULMONIC VALVE  The pulmonic valve is not well visualized. There is no pulmonic  valvular regurgitation. There is no pulmonic valvular stenosis.   ARTERIES  The aortic sinus is normal size. The ascending aorta is mildly dilated  at 4.0 cm.   VENOUS  Pulmonary venous flow pattern is normal. The IVC is normal in size  with an inspiratory collapse of greater then 50%, suggesting normal  right atrial pressure.   EFFUSION  There is no pericardial effusion. There is no pleural effusion.        Past Medical History:  Diagnosis Date   Arthritis      knees and shoulders   COVID-19     Diverticulosis of sigmoid colon 11/29/2012   Duodenitis without mention of hemorrhage     Gallstones     GERD (gastroesophageal reflux disease)     History of hiatal hernia     Internal hemorrhoids without mention of complication     Kidney stone     MVP (mitral valve prolapse)     Right bundle branch block     Seasonal allergies           Past Surgical History:  Procedure Laterality Date   CHOLECYSTECTOMY N/A 08/22/2017    Procedure: LAPAROSCOPIC CHOLECYSTECTOMY WITH INTRAOPERATIVE  CHOLANGIOGRAM;  Surgeon: Coralie Keens, MD;  Location: [facility];  Service: General;  Laterality: N/A;   DENTAL SURGERY       EYE SURGERY        blephorplasty          Current Outpatient Medications on File Prior to Visit  Medication Sig Dispense Refill   Azelaic Acid (FINACEA) 15 % cream Apply topically daily. After skin is thoroughly washed and patted dry, gently but thoroughly massage a thin film of azelaic acid cream into the affected area twice daily, in the morning and evening.       cetirizine (ZYRTEC) 5 MG tablet Take 5 mg by mouth daily.       Cholecalciferol (VITAMIN D3 SUPER STRENGTH) 50 MCG (2000 UT) CAPS Take 2,000 Units by mouth daily.       cimetidine (TAGAMET) 200 MG tablet Take 200 mg by mouth at bedtime.  cyanocobalamin (VITAMIN B12) 1000 MCG tablet Take 1,000 mcg by mouth daily.       esomeprazole (NEXIUM) 40 MG capsule Take 40 mg by mouth daily before breakfast.       hyoscyamine (ANASPAZ) 0.125 MG TBDP disintergrating tablet Place 1 tablet (0.125 mg total) under the tongue daily as needed. 60 tablet 3   levocetirizine (XYZAL) 5 MG tablet Take 5 mg by mouth daily in the afternoon.       Melatonin 10 MG TBCR Take 1 tablet by mouth daily in the afternoon.       methocarbamol (ROBAXIN) 500 MG tablet Take 500 mg by mouth as needed for muscle spasms.       olopatadine (PATADAY) 0.1 % ophthalmic solution 1 drop 2 (two) times daily.       simethicone (MYLICON) 502 MG chewable tablet Chew 125 mg by mouth every 6 (six) hours as needed for flatulence.        No current facility-administered medications on file prior to visit.         Allergies  Allergen Reactions   Sulfonamide Derivatives Swelling      Swelling in face   Rocephin [Ceftriaxone Sodium In Dextrose]        Blood in stool   Statins     Sulfa Antibiotics     Penicillins Rash    Current Medications, Allergies, Past Medical History, Past Surgical History, Family History and Social History  were reviewed in [employer] record.   Review of Systems:   Constitutional: + Intentional weight loss.  Respiratory: Negative for shortness of breath.   Cardiovascular: Negative for chest pain, palpitations and leg swelling.  Gastrointestinal: See HPI.  Musculoskeletal: Negative for back pain or muscle aches.  Neurological: Negative for dizziness, headaches or paresthesias.    Physical Exam: BP 120/60   Pulse 75   Ht _0  (1.803 m)   Wt 164 lb (74.4 kg)   BMI 22.87 kg/m       Wt Readings from Last 3 Encounters:  08/12/22 164 lb (74.4 kg)  02/28/20 174 lb (78.9 kg)  09/13/18 169 lb 14.4 oz (77.1 kg)    General: 71 year old male in no acute distress. Head: Normocephalic and atraumatic. Eyes: No scleral icterus. Conjunctiva pink . Ears: Normal auditory acuity. Mouth: Dentition intact. No ulcers or lesions.  Lungs: Clear throughout to auscultation. Heart: Regular rate and rhythm. Systolic murmur.  Abdomen: Soft, nontender and nondistended. No masses or hepatomegaly. Normal bowel sounds x 4 quadrants.  Rectal: Deferred. Musculoskeletal: Symmetrical with no gross deformities. Extremities: No edema. Neurological: Alert oriented x 4. No focal deficits.  Psychological: Alert and cooperative. Normal mood and affect   Assessment and Recommendations:   30) 71 year old male with IBS, recent flare with loose stools and mild abdominal cramping, resolved -Patient to contact office if loose stools recur -Consider course of Xifaxan if loose stools recur  -Hyoscyamine 0.125 mg 1 tab sublingual every 8 hours as needed, patient counseled to stop taking hyoscyamine if he develops dizziness or lightheadedness after taking it   2) GERD, stable  -Continue Esomeprazole 40 mg daily and Cimetidine 200 mg nightly -GERD diet   3) History of a hyperplastic colon polyp 11/2012. Maternal aunt with history of colon cancer.  -Colonoscopy benefits and risks discussed including  risk with sedation, risk of bleeding, perforation and infection  -Schedule colonoscopy 11/2022 -Patient will contact her office if he has any change in his overall health status prior to  his colonoscopy date   4) Hepatic steatosis. Normal LFTs.  He has intentionally lost 68 pounds over the past 6 months. -Avoid weight gain. -See plan in # 5

## 2022-12-10 ENCOUNTER — Telehealth: Payer: Self-pay

## 2022-12-10 NOTE — Telephone Encounter (Signed)
  Follow up Call-     12/09/2022    7:05 AM  Call back number  Post procedure Call Back phone  # [phone]  Permission to leave phone message Yes    Post op call attempted, no answer, left WM.
